# Patient Record
Sex: Male | Born: 1960 | Race: White | Hispanic: No | Smoking: Former smoker
Health system: Southern US, Community
[De-identification: ages and names within clinical notes are randomized; demographics above are authoritative.]

## PROBLEM LIST (undated history)

## (undated) DIAGNOSIS — M4800 Spinal stenosis, site unspecified: Secondary | ICD-10-CM

## (undated) DIAGNOSIS — R202 Paresthesia of skin: Secondary | ICD-10-CM

## (undated) DIAGNOSIS — I639 Cerebral infarction, unspecified: Secondary | ICD-10-CM

## (undated) DIAGNOSIS — B1920 Unspecified viral hepatitis C without hepatic coma: Secondary | ICD-10-CM

## (undated) HISTORY — PX: NECK SURGERY: SHX720

## (undated) HISTORY — DX: Paresthesia of skin: R20.2

## (undated) HISTORY — PX: SPINE SURGERY: SHX786

## (undated) HISTORY — DX: Spinal stenosis, site unspecified: M48.00

## (undated) HISTORY — PX: ELBOW SURGERY: SHX618

## (undated) HISTORY — PX: KNEE SURGERY: SHX244

---

## 2012-04-08 ENCOUNTER — Emergency Department: Payer: Self-pay | Admitting: Emergency Medicine

## 2013-02-20 ENCOUNTER — Inpatient Hospital Stay: Payer: Self-pay | Admitting: Psychiatry

## 2013-02-20 LAB — URINALYSIS, COMPLETE
Bacteria: NONE SEEN
Bilirubin,UR: NEGATIVE
Glucose,UR: NEGATIVE mg/dL
Ketone: NEGATIVE
Leukocyte Esterase: NEGATIVE
Nitrite: NEGATIVE
Ph: 5
Protein: NEGATIVE
RBC,UR: 1 /HPF
Specific Gravity: 1.002
Squamous Epithelial: NONE SEEN
WBC UR: NONE SEEN /HPF

## 2013-02-20 LAB — DRUG SCREEN, URINE
Amphetamines, Ur Screen: NEGATIVE
Barbiturates, Ur Screen: NEGATIVE
Benzodiazepine, Ur Scrn: NEGATIVE
Cannabinoid 50 Ng, Ur ~~LOC~~: NEGATIVE
Cocaine Metabolite,Ur ~~LOC~~: POSITIVE
MDMA (Ecstasy)Ur Screen: NEGATIVE
Methadone, Ur Screen: NEGATIVE
Opiate, Ur Screen: NEGATIVE
Phencyclidine (PCP) Ur S: NEGATIVE
Tricyclic, Ur Screen: NEGATIVE

## 2013-02-20 LAB — COMPREHENSIVE METABOLIC PANEL
ANION GAP: 2 — AB (ref 7–16)
Albumin: 4.4 g/dL (ref 3.4–5.0)
Alkaline Phosphatase: 62 U/L
BILIRUBIN TOTAL: 0.4 mg/dL (ref 0.2–1.0)
BUN: 6 mg/dL — ABNORMAL LOW (ref 7–18)
CALCIUM: 9.3 mg/dL (ref 8.5–10.1)
CHLORIDE: 103 mmol/L (ref 98–107)
CREATININE: 0.99 mg/dL (ref 0.60–1.30)
Co2: 28 mmol/L (ref 21–32)
EGFR (Non-African Amer.): >60
GLUCOSE: 92 mg/dL (ref 65–99)
OSMOLALITY: 264 (ref 275–301)
Potassium: 3.9 mmol/L (ref 3.5–5.1)
SGOT(AST): 211 U/L — ABNORMAL HIGH (ref 15–37)
SGPT (ALT): 238 U/L — ABNORMAL HIGH (ref 12–78)
Sodium: 133 mmol/L — ABNORMAL LOW (ref 136–145)
Total Protein: 8.9 g/dL — ABNORMAL HIGH (ref 6.4–8.2)

## 2013-02-20 LAB — ETHANOL
Ethanol %: 0.134 % — ABNORMAL HIGH (ref 0.000–0.080)
Ethanol: 134 mg/dL

## 2013-02-20 LAB — SALICYLATE LEVEL: Salicylates, Serum: 2.5 mg/dL

## 2013-02-20 LAB — ACETAMINOPHEN LEVEL: Acetaminophen: <2 ug/mL

## 2013-02-20 LAB — CBC
HCT: 49.3 % (ref 40.0–52.0)
HGB: 16.7 g/dL (ref 13.0–18.0)
MCH: 32.9 pg (ref 26.0–34.0)
MCHC: 34 g/dL (ref 32.0–36.0)
MCV: 97 fL (ref 80–100)
Platelet: 191 10*3/uL (ref 150–440)
RBC: 5.09 10*6/uL (ref 4.40–5.90)
RDW: 13.6 % (ref 11.5–14.5)
WBC: 6.9 10*3/uL (ref 3.8–10.6)

## 2013-06-04 ENCOUNTER — Emergency Department: Payer: Self-pay | Admitting: Emergency Medicine

## 2013-06-04 LAB — COMPREHENSIVE METABOLIC PANEL
ALT: 29 U/L (ref 12–78)
AST: 54 U/L — AB (ref 15–37)
Albumin: 3.6 g/dL (ref 3.4–5.0)
Alkaline Phosphatase: 64 U/L
Anion Gap: 11 (ref 7–16)
BUN: 11 mg/dL (ref 7–18)
Bilirubin,Total: 0.3 mg/dL (ref 0.2–1.0)
CREATININE: 1.73 mg/dL — AB (ref 0.60–1.30)
Calcium, Total: 8.3 mg/dL — ABNORMAL LOW (ref 8.5–10.1)
Chloride: 107 mmol/L (ref 98–107)
Co2: 22 mmol/L (ref 21–32)
EGFR (African American): 52 — ABNORMAL LOW
EGFR (Non-African Amer.): 45 — ABNORMAL LOW
Glucose: 93 mg/dL (ref 65–99)
Osmolality: 278 (ref 275–301)
Potassium: 3.8 mmol/L (ref 3.5–5.1)
SODIUM: 140 mmol/L (ref 136–145)
TOTAL PROTEIN: 7.5 g/dL (ref 6.4–8.2)

## 2013-06-04 LAB — URINALYSIS, COMPLETE
Bilirubin,UR: NEGATIVE
Blood: NEGATIVE
GLUCOSE, UR: NEGATIVE mg/dL (ref 0–75)
Ketone: NEGATIVE
LEUKOCYTE ESTERASE: NEGATIVE
Nitrite: NEGATIVE
PROTEIN: NEGATIVE
Ph: 5 (ref 4.5–8.0)
RBC,UR: <1 /HPF (ref 0–5)
SPECIFIC GRAVITY: 1.004 (ref 1.003–1.030)
Squamous Epithelial: NONE SEEN
WBC UR: NONE SEEN /HPF (ref 0–5)

## 2013-06-04 LAB — DRUG SCREEN, URINE
AMPHETAMINES, UR SCREEN: NEGATIVE (ref ?–1000)
Barbiturates, Ur Screen: NEGATIVE (ref ?–200)
Benzodiazepine, Ur Scrn: NEGATIVE (ref ?–200)
CANNABINOID 50 NG, UR ~~LOC~~: NEGATIVE (ref ?–50)
Cocaine Metabolite,Ur ~~LOC~~: POSITIVE (ref ?–300)
MDMA (ECSTASY) UR SCREEN: NEGATIVE (ref ?–500)
Methadone, Ur Screen: NEGATIVE (ref ?–300)
Opiate, Ur Screen: NEGATIVE (ref ?–300)
PHENCYCLIDINE (PCP) UR S: NEGATIVE (ref ?–25)
Tricyclic, Ur Screen: NEGATIVE (ref ?–1000)

## 2013-06-04 LAB — CBC
HCT: 43.7 % (ref 40.0–52.0)
HGB: 15 g/dL (ref 13.0–18.0)
MCH: 32.1 pg (ref 26.0–34.0)
MCHC: 34.2 g/dL (ref 32.0–36.0)
MCV: 94 fL (ref 80–100)
PLATELETS: 222 10*3/uL (ref 150–440)
RBC: 4.66 10*6/uL (ref 4.40–5.90)
RDW: 13 % (ref 11.5–14.5)
WBC: 9.8 10*3/uL (ref 3.8–10.6)

## 2013-06-04 LAB — ETHANOL
Ethanol %: 0.133 % — ABNORMAL HIGH (ref 0.000–0.080)
Ethanol: 133 mg/dL

## 2013-06-04 LAB — TSH: THYROID STIMULATING HORM: 0.875 u[IU]/mL

## 2013-06-04 LAB — SALICYLATE LEVEL: Salicylates, Serum: 2.4 mg/dL

## 2013-06-04 LAB — TROPONIN I

## 2013-06-04 LAB — ACETAMINOPHEN LEVEL

## 2014-05-19 NOTE — H&P (Signed)
PATIENT NAME:  Steven Steven Gardner, Steven Steven Gardner#:  [id number] DATE OF BIRTH:  Feb 24, 1960  DATE OF ADMISSION:  02/20/2013  REFERRING PHYSICIAN: Emergency Room MD   ATTENDING PHYSICIAN: Steven B. Jennet MaduroPucilowska, MD   IDENTIFYING DATA: Steven [name] Steven Steven Gardner is a 54 year old male with history of alcoholism and cocaine use.   CHIEF COMPLAINT: "I need detox."   HISTORY OF PRESENT ILLNESS:  Steven [name] Steven Steven Gardner came to [facility] Emergency Room asking for alcohol detox. Initially, we planned to send him to [facility]. However, it turns out that the patient is a registered sex offender and was not accepted at [facility]. He was admitted to the hospital. He reports heavy drinking of a case of beer each day. He also has been using cocaine but minimizes his use. He denies symptoms of depression, psychosis or anxiety. He has never been in treatment for substance abuse and does not report any period of sobriety since he started drinking in his teens.   PAST PSYCHIATRIC HISTORY: As above. No suicide attempts. No substance abuse treatment.   FAMILY PSYCHIATRIC HISTORY: Mother with anxiety.   PAST MEDICAL HISTORY: Hepatitis B and C, status post back injury, chronic pain, status post knee surgery.   ALLERGIES: No known drug allergies.   MEDICATIONS ON ADMISSION: None.   SOCIAL HISTORY: He is from [place] and would very much like to go to a big city. He is married and has a 608-year-old son. The patient was staying with his father, who is now demented and placed in a nursing home. Unable to provide for the family. His wife and 318-year-old son relocated [place] where they are staying at a homeless shelter. The patient ended up in [place]. He tells me that he is disabled following a back injury that was work related many years ago. He is not able to afford any medical care, even though he believes that he needs medication for his back problem. He lost his Medicaid but apparently has it back. He has his Medicaid card for the  past 2 weeks and is looking to establish care with new providers. He does not think that he will be staying in [place] area.   REVIEW OF SYSTEMS:   CONSTITUTIONAL: No fevers or chills. No weight changes.  EYES: No double or blurred vision.  ENT: No hearing loss.  RESPIRATORY: No shortness of breath or cough.  CARDIOVASCULAR: No chest pain or orthopnea.  GASTROINTESTINAL: No abdominal pain, nausea, vomiting or diarrhea.  GENITOURINARY: No incontinence or frequency.  ENDOCRINE: No heat or cold intolerance.  LYMPHATIC: No anemia or easy bruising.  INTEGUMENTARY: No acne or rash.  MUSCULOSKELETAL: Positive for back and knee pain.  NEUROLOGIC: No tingling or weakness.  PSYCHIATRIC: See history of present illness for details.   PHYSICAL EXAMINATION: VITAL SIGNS: Blood pressure 139/93, pulse 84, respirations 20, temperature 98.2.  GENERAL: This is an obese male in no acute distress.  HEENT: The pupils are equal, round, and reactive to light. Sclerae are anicteric.  NECK: Supple. No thyromegaly.  LUNGS: Clear to auscultation. No dullness to percussion.  HEART: Regular rhythm and rate. No murmurs, rubs or gallops.  ABDOMEN: Soft, nontender, nondistended. Positive bowel sounds.  MUSCULOSKELETAL: Normal muscle strength in all extremities.  SKIN: No rashes or bruises.  LYMPHATIC: No cervical adenopathy.  NEUROLOGIC: Cranial nerves II through XII are intact.   LABORATORY DATA: Chemistries are within normal limits, except sodium 133. Blood alcohol level 0.134. LFTs within normal limits except for AST of 211 and ALT of  238. Urine tox screen is positive for cocaine. CBC within normal limits. Urinalysis is not suggestive of urinary tract infection. Serum acetaminophen and salicylates are low.   MENTAL STATUS EXAMINATION ON ADMISSION: The patient is asleep and not really excited to be talking to me. I let him sleep in the morning, came back in the afternoon. He is more awake, pleasant, polite, and  cooperative. He is poorly groomed, wearing hospital scrubs. He maintains limited eye contact. His speech is soft. Mood is depressed with flat affect. Thought process: Logical and goal oriented. Thought content: He denies suicidal or homicidal ideation. There are no delusions or paranoia. There are no auditory or visual hallucinations. His cognition is grossly intact. He registers 3/3 and recalls 3/3 objects after 5 minutes. He can spell "world" forward and backward. He knows current president. His intelligence and fund of knowledge are average. His insight and judgment are poor.   SUICIDE RISK ASSESSMENT ON ADMISSION: This is a patient with history of alcoholism and some anxiety, but no mood symptoms.   DIAGNOSES: AXIS I: Alcohol dependence, cocaine dependence.  AXIS II: Deferred.  AXIS III: Hepatitis B and C, obesity, chronic pain.  AXIS IV: Substance abuse, housing, employment, financial, family conflict, primary support.  AXIS V: Global Assessment of Functioning 35.   PLAN: The patient was admitted to [facility] Medicine Unit for safety, stabilization, and medication management. He was initially placed on suicide precautions and was closely monitored for any unsafe behaviors. He underwent full psychiatric and risk assessment. He received pharmacotherapy, individual and group psychotherapy, substance abuse counseling, and support from therapeutic milieu.  1.  Alcohol detox: He is on a CIWA protocol. We added Librium to limit p.r.n. Ativan. We will monitor symptoms of withdrawal.  2.  Substance abuse treatment: The patient thinks that he can go for long-term treatment center in a big city. He will talk to substance abuse counselor about that. He is not interested in going to [facility] rehab facility.  3.  Disposition: To be established.    ____________________________ Ellin Goodie. Jennet Maduro, MD jbp:jcm D: 02/21/2013 14:43:29 ET T: 02/21/2013 15:36:36  ET JOB#: [id number]  cc: Steven B. Jennet Maduro, MD, <Dictator> Shari Prows MD ELECTRONICALLY SIGNED 03/25/2013 6:34

## 2015-03-28 ENCOUNTER — Emergency Department
Admission: EM | Admit: 2015-03-28 | Discharge: 2015-03-28 | Disposition: A | Discharge status: Home / Self Care | Attending: Emergency Medicine | Admitting: Emergency Medicine

## 2015-03-28 ENCOUNTER — Emergency Department

## 2015-03-28 ENCOUNTER — Encounter: Payer: Self-pay | Admitting: Emergency Medicine

## 2015-03-28 DIAGNOSIS — Y92148 Other place in prison as the place of occurrence of the external cause: Secondary | ICD-10-CM | POA: Diagnosis not present

## 2015-03-28 DIAGNOSIS — S01111A Laceration without foreign body of right eyelid and periocular area, initial encounter: Secondary | ICD-10-CM | POA: Diagnosis not present

## 2015-03-28 DIAGNOSIS — E119 Type 2 diabetes mellitus without complications: Secondary | ICD-10-CM | POA: Diagnosis not present

## 2015-03-28 DIAGNOSIS — Z87891 Personal history of nicotine dependence: Secondary | ICD-10-CM | POA: Diagnosis not present

## 2015-03-28 DIAGNOSIS — W01198A Fall on same level from slipping, tripping and stumbling with subsequent striking against other object, initial encounter: Secondary | ICD-10-CM | POA: Insufficient documentation

## 2015-03-28 DIAGNOSIS — F10239 Alcohol dependence with withdrawal, unspecified: Secondary | ICD-10-CM | POA: Insufficient documentation

## 2015-03-28 DIAGNOSIS — S0990XA Unspecified injury of head, initial encounter: Secondary | ICD-10-CM | POA: Diagnosis present

## 2015-03-28 DIAGNOSIS — Y998 Other external cause status: Secondary | ICD-10-CM | POA: Insufficient documentation

## 2015-03-28 DIAGNOSIS — Y9389 Activity, other specified: Secondary | ICD-10-CM | POA: Insufficient documentation

## 2015-03-28 DIAGNOSIS — S0191XA Laceration without foreign body of unspecified part of head, initial encounter: Secondary | ICD-10-CM

## 2015-03-28 HISTORY — DX: Cerebral infarction, unspecified: I63.9

## 2015-03-28 HISTORY — DX: Unspecified viral hepatitis C without hepatic coma: B19.20

## 2015-03-28 MED ORDER — LORAZEPAM 2 MG PO TABS
2.0000 mg | ORAL_TABLET | Freq: Once | ORAL | Status: AC
  Administered 2015-03-28: 2 mg via ORAL
  Filled 2015-03-28: qty 1

## 2015-03-28 NOTE — ED Notes (Signed)
Approx 1 inch laceration to outer right temple sustained earlier tonight when patient fell out of bed, no LOC.

## 2015-03-28 NOTE — ED Notes (Addendum)
Pt hands and feet cuffed with handcuffs/shackles by [employer]. Color WNL, pulses strong and present. Pt in NAD. Officer at bedside.

## 2015-03-28 NOTE — ED Notes (Signed)
Pt ambulatory to triage, from jail; Pt reports fell OOB while sleeping and hit head on table;lac to right side temple; pt reports generalized HA; st drinks daily (8-10 40s/day); last drink this morning; st going thru withdrawal and was told they could not give him anything  ; pt with tremors

## 2015-03-28 NOTE — ED Notes (Signed)
Patient transported to CT 

## 2015-03-28 NOTE — Discharge Instructions (Signed)
Stitches, Staples, or Adhesive Wound Closure °Health care providers use stitches (sutures), staples, and certain glue (skin adhesives) to hold skin together while it heals (wound closure). You may need this treatment after you have surgery or if you cut your skin accidentally. These methods help your skin to heal more quickly and make it less likely that you will have a scar. A wound may take several months to heal completely. °The type of wound you have determines when your wound gets closed. In most cases, the wound is closed as soon as possible (primary skin closure). Sometimes, closure is delayed so the wound can be cleaned and allowed to heal naturally. This reduces the chance of infection. Delayed closure may be needed if your wound: °· Is caused by a bite. °· Happened more than 6 hours ago. °· Involves loss of skin or the tissues under the skin. °· Has dirt or debris in it that cannot be removed. °· Is infected. °WHAT ARE THE DIFFERENT KINDS OF WOUND CLOSURES? °There are many options for wound closure. The one that your health care provider uses depends on how deep and how large your wound is. °Adhesive Glue °To use this type of glue to close a wound, your health care provider holds the edges of the wound together and paints the glue on the surface of your skin. You may need more than one layer of glue. Then the wound may be covered with a light bandage (dressing). °This type of skin closure may be used for small wounds that are not deep (superficial). Using glue for wound closure is less painful than other methods. It does not require a medicine that numbs the area (local anesthetic). This method also leaves nothing to be removed. Adhesive glue is often used for children and on facial wounds. °Adhesive glue cannot be used for wounds that are deep, uneven, or bleeding. It is not used inside of a wound.  °Adhesive Strips °These strips are made of sticky (adhesive), porous paper. They are applied across your  skin edges like a regular adhesive bandage. You leave them on until they fall off. °Adhesive strips may be used to close very superficial wounds. They may also be used along with sutures to improve the closure of your skin edges.  °Sutures °Sutures are the oldest method of wound closure. Sutures can be made from natural substances, such as silk, or from synthetic materials, such as nylon and steel. They can be made from a material that your body can break down as your wound heals (absorbable), or they can be made from a material that needs to be removed from your skin (nonabsorbable). They come in many different strengths and sizes. °Your health care provider attaches the sutures to a steel needle on one end. Sutures can be passed through your skin, or through the tissues beneath your skin. Then they are tied and cut. Your skin edges may be closed in one continuous stitch or in separate stitches. °Sutures are strong and can be used for all kinds of wounds. Absorbable sutures may be used to close tissues under the skin. The disadvantage of sutures is that they may cause skin reactions that lead to infection. Nonabsorbable sutures need to be removed. °Staples °When surgical staples are used to close a wound, the edges of your skin on both sides of the wound are brought close together. A staple is placed across the wound, and an instrument secures the edges together. Staples are often used to close surgical cuts (  incisions). °Staples are faster to use than sutures, and they cause less skin reaction. Staples need to be removed using a tool that bends the staples away from your skin. °HOW DO I CARE FOR MY WOUND CLOSURE? °· Take medicines only as directed by your health care provider. °· If you were prescribed an antibiotic medicine for your wound, finish it all even if you start to feel better. °· Use ointments or creams only as directed by your health care provider. °· Wash your hands with soap and water before and  after touching your wound. °· Do not soak your wound in water. Do not take baths, swim, or use a hot tub until your health care provider approves. °· Ask your health care provider when you can start showering. Cover your wound if directed by your health care provider. °· Do not take out your own sutures or staples. °· Do not pick at your wound. Picking can cause an infection. °· Keep all follow-up visits as directed by your health care provider. This is important. °HOW LONG WILL I HAVE MY WOUND CLOSURE? °· Leave adhesive glue on your skin until the glue peels away. °· Leave adhesive strips on your skin until the strips fall off. °· Absorbable sutures will dissolve within several days. °· Nonabsorbable sutures and staples must be removed. The location of the wound will determine how long they stay in. This can range from several days to a couple of weeks. °WHEN SHOULD I SEEK HELP FOR MY WOUND CLOSURE? °Contact your health care provider if: °· You have a fever. °· You have chills. °· You have drainage, redness, swelling, or pain at your wound. °· There is a bad smell coming from your wound. °· The skin edges of your wound start to separate after your sutures have been removed. °· Your wound becomes thick, raised, and darker in color after your sutures come out (scarring). °  °This information is not intended to replace advice given to you by your health care provider. Make sure you discuss any questions you have with your health care provider. °  °Document Released: 10/07/2000 Document Revised: 02/02/2014 Document Reviewed: 06/21/2013 °Elsevier Interactive Patient Education ©2016 [employer] Inc[employer] ° °Head Injury, Adult °You have a head injury. Headaches and throwing up (vomiting) are common after a head injury. It should be easy to wake up from sleeping. Sometimes you must stay in the hospital. Most problems happen within the first 24 hours. Side effects may occur up to 7-10 days after the injury.  °WHAT ARE THE TYPES OF  HEAD INJURIES? °Head injuries can be as minor as a bump. Some head injuries can be more severe. More severe head injuries include: °· A jarring injury to the brain (concussion). °· A bruise of the brain (contusion). This mean there is bleeding in the brain that can cause swelling. °· A cracked skull (skull fracture). °· Bleeding in the brain that collects, clots, and forms a bump (hematoma). °WHEN SHOULD I GET HELP RIGHT AWAY?  °· You are confused or sleepy. °· You cannot be woken up. °· You feel sick to your stomach (nauseous) or keep throwing up (vomiting). °· Your dizziness or unsteadiness is getting worse. °· You have very bad, lasting headaches that are not helped by medicine. Take medicines only as told by your doctor. °· You cannot use your arms or legs like normal. °· You cannot walk. °· You notice changes in the black spots in the center of the colored part of   your eye (pupil). °· You have clear or bloody fluid coming from your nose or ears. °· You have trouble seeing. °During the next 24 hours after the injury, you must stay with someone who can watch you. This person should get help right away (call 911 in the [place] if you start to shake and are not able to control it (have seizures), you pass out, or you are unable to wake up. °HOW CAN I PREVENT A HEAD INJURY IN THE FUTURE? °· Wear seat belts. °· Wear a helmet while bike riding and playing sports like football. °· Stay away from dangerous activities around the house. °WHEN CAN I RETURN TO NORMAL ACTIVITIES AND ATHLETICS? °See your doctor before doing these activities. You should not do normal activities or play contact sports until 1 week after the following symptoms have stopped: °· Headache that does not go away. °· Dizziness. °· Poor attention. °· Confusion. °· Memory problems. °· Sickness to your stomach or throwing up. °· Tiredness. °· Fussiness. °· Bothered by bright lights or loud noises. °· Anxiousness or depression. °· Restless sleep. °MAKE SURE  YOU:  °· Understand these instructions. °· Will watch your condition. °· Will get help right away if you are not doing well or get worse. °  °This information is not intended to replace advice given to you by your health care provider. Make sure you discuss any questions you have with your health care provider. °  °Document Released: 12/26/2007 Document Revised: 02/02/2014 Document Reviewed: 09/19/2012 °Elsevier Interactive Patient Education ©2016 Elsevier Inc. ° °

## 2015-03-28 NOTE — ED Provider Notes (Signed)
Time Seen: Approximately 0 300  I have reviewed the triage notes  Chief Complaint: Laceration   History of Present Illness: Steven Gardner is a 55 y.o. male who states he has a long history of alcohol abuse and he was having some withdrawal type symptoms of agitation in bed dreams and apparently fell out of the bunk at jail striking his right temporal area on a table. Patient states he had some nausea but no persistent vomiting states he's been having some mild tremors without hallucinations. Patient's currently in custody of the [facility] department. They currently have the patient in handcuffs. Patient denies any injury outside of a laceration right lateral surface of the eye is  on his face. He has normal vision and there does not appear to be any trauma directly to the eye. Since his last tetanus shot was within 10 years and he denies any neck, thoracic, lumbar spine pain   Past Medical History  Diagnosis Date  . Stroke (HCC)   . Hepatitis C   . Diabetes mellitus without complication (HCC)     There are no active problems to display for this patient.   Past Surgical History  Procedure Laterality Date  . Elbow surgery    . Neck surgery    . Knee surgery      Past Surgical History  Procedure Laterality Date  . Elbow surgery    . Neck surgery    . Knee surgery      No current outpatient prescriptions on file.  Allergies:  Review of patient's allergies indicates no known allergies.  Family History: No family history on file.  Social History: Social History  Substance Use Topics  . Smoking status: Former Games developer  . Smokeless tobacco: None  . Alcohol Use: Yes     Comment: 8-10 40oz/beer daily     Review of Systems:   Constitutional: No fever Eyes: No visual disturbances Cardiac: No chest pain Respiratory: No shortness of breath, wheezing, or stridor Abdomen: No abdominal pain, no vomiting, No diarrhea Extremities: No extremity pain or trauma  Skin: No rashes, easy bruising Neurologic: No focal weakness, trouble with speech or swollowing   Physical Exam:  ED Triage Vitals  Enc Vitals Group     BP 03/28/15 0233 156/92 mmHg     Pulse Rate 03/28/15 0233 82     Resp 03/28/15 0233 20     Temp 03/28/15 0233 97.9 F (36.6 C)     Temp Source 03/28/15 0233 Oral     SpO2 03/28/15 0233 97 %     Weight 03/28/15 0233 200 lb (90.719 kg)     Height 03/28/15 0233  (1.803 m)     Head Cir --      Peak Flow --      Pain Score 03/28/15 0233 7     Pain Loc --      Pain Edu? --      Excl. in GC? --     General: Awake , Alert , and Oriented times 3; GCS 15 Head: Patient has a 1 cm somewhat jagged linear vertically located laceration lateral to his right eye. There is no facial instability. Eyes: Pupils equal , round, reactive to light Nose/Throat: No nasal drainage, patent upper airway without erythema or exudate.  Neck: Supple, Full range of motion, No anterior adenopathy or palpable thyroid masses Lungs: Clear to ascultation without wheezes , rhonchi, or rales Heart: Regular rate, regular rhythm without murmurs , gallops ,  or rubs Extremities: 2 plus symmetric pulses. No edema, clubbing or cyanosis Neurologic:, Motor symmetric without deficits, sensory intact Skin: warm, dry, no rashes     Radiology: *  EXAM: CT HEAD WITHOUT CONTRAST  TECHNIQUE: Contiguous axial images were obtained from the base of the skull through the vertex without intravenous contrast.  COMPARISON: None.  FINDINGS: There is no evidence of acute infarction, mass lesion, or intra- or extra-axial hemorrhage on CT.  The posterior fossa, including the cerebellum, brainstem and fourth ventricle, is within normal limits. The third and lateral ventricles, and basal ganglia are unremarkable in appearance. The cerebral hemispheres are symmetric in appearance, with normal gray-white differentiation. No mass effect or midline shift is seen.  There is  no evidence of fracture; visualized osseous structures are unremarkable in appearance. The visualized portions of the orbits are within normal limits. The paranasal sinuses and mastoid air cells are well-aerated. The patient's right-sided soft tissue laceration is not well characterized on CT.  IMPRESSION: No evidence of traumatic intracranial injury or fracture.   Electronically Signed By: Roanna Raider M.D. On: 03/28/2015 03:27     I personally reviewed the radiologic studies   Procedures: * Patient had repair of his right facial laceration with tissue adhesive glue. The laceration is simple closure single layer with a glue which patient was observed and appeared to tolerate the procedure well. His wound was previously cleaned   ED Course:  Patient's stay here was uneventful he does not appear to suffer any significant head injury from his fall such as a subdural, epidural, or significant contusion. Patient's laceration was repaired. He also received Ativan for his withdrawal symptoms which seemed to decrease his tenderness. No felt he was not likely to be an delirium tremens at this time.  Assessment: Head laceration Acute closed head injury Mild alcohol withdrawal symptoms Laceration closure with adhesive glue 1 cm non-complicated   Plan:  Outpatient Patient was discharged in custody of the police department. Patient was advised to return immediately if condition worsens. Patient was advised to follow up with their primary care physician or other specialized physicians involved in their outpatient care             Jennye Moccasin, MD 03/28/15 [phone]

## 2015-03-28 NOTE — ED Notes (Signed)
Pt alert and oriented X4, active, cooperative, pt in NAD. RR even and unlabored, color WNL.  Pt informed to return if any life threatening symptoms occur.   

## 2016-05-29 ENCOUNTER — Emergency Department
Admission: EM | Admit: 2016-05-29 | Discharge: 2016-05-29 | Disposition: A | Discharge status: Home / Self Care | Attending: Student in an Organized Health Care Education/Training Program | Admitting: Student in an Organized Health Care Education/Training Program

## 2016-05-29 ENCOUNTER — Encounter: Payer: Self-pay | Admitting: Emergency Medicine

## 2016-05-29 ENCOUNTER — Emergency Department

## 2016-05-29 DIAGNOSIS — Z87891 Personal history of nicotine dependence: Secondary | ICD-10-CM | POA: Insufficient documentation

## 2016-05-29 DIAGNOSIS — R930 Abnormal findings on diagnostic imaging of skull and head, not elsewhere classified: Secondary | ICD-10-CM | POA: Insufficient documentation

## 2016-05-29 DIAGNOSIS — M542 Cervicalgia: Secondary | ICD-10-CM | POA: Insufficient documentation

## 2016-05-29 DIAGNOSIS — E119 Type 2 diabetes mellitus without complications: Secondary | ICD-10-CM | POA: Insufficient documentation

## 2016-05-29 DIAGNOSIS — F1010 Alcohol abuse, uncomplicated: Secondary | ICD-10-CM

## 2016-05-29 DIAGNOSIS — F1019 Alcohol abuse with unspecified alcohol-induced disorder: Secondary | ICD-10-CM | POA: Insufficient documentation

## 2016-05-29 LAB — GLUCOSE, CAPILLARY: Glucose-Capillary: 88 mg/dL (ref 65–99)

## 2016-05-29 MED ORDER — ONDANSETRON 4 MG PO TBDP
4.0000 mg | ORAL_TABLET | Freq: Once | ORAL | Status: AC
  Administered 2016-05-29: 4 mg via ORAL
  Filled 2016-05-29: qty 1

## 2016-05-29 NOTE — ED Provider Notes (Signed)
[facility] Emergency Department Provider Note    First MD Initiated Contact with Patient 05/29/16 1931     (approximate)  I have reviewed the triage vital signs and the nursing notes.   HISTORY  Chief Complaint Medical Clearance    HPI DARAY POLGAR is a 56 y.o. male with remote history of stroke as well as status post laminectomy and multiple chronic back pain issues presents to the ER and police custody after being arrested for DUI today. Patient was taken to prison and began complaining of multiple complaints including neck pain from multiple falls as well as the numbness and weakness in his arms. States the numbness is more on the right side. Denies any chest pain. No shortness of breath. No abdominal pain. Has had chronic numbness to his lower legs. This is not new.   Past Medical History:  Diagnosis Date  . Diabetes mellitus without complication (HCC)   . Hepatitis C   . Stroke [facility])    No family history on file. Past Surgical History:  Procedure Laterality Date  . ELBOW SURGERY    . KNEE SURGERY    . NECK SURGERY     There are no active problems to display for this patient.     Prior to Admission medications   Not on File    Allergies Patient has no known allergies.    Social History Social History  Substance Use Topics  . Smoking status: Former Games developer  . Smokeless tobacco: Never Used  . Alcohol use Yes     Comment: 8-10 40oz/beer daily    Review of Systems Patient denies headaches, rhinorrhea, blurry vision, numbness, shortness of breath, chest pain, edema, cough, abdominal pain, nausea, vomiting, diarrhea, dysuria, fevers, rashes or hallucinations unless otherwise stated above in HPI. ____________________________________________   PHYSICAL EXAM:  VITAL SIGNS: Vitals:   05/29/16 1902  BP: 131/83  Pulse: 89  Resp: 18  Temp: 98.6 F (37 C)    Constitutional: Alert and oriented. Disheveled, in no acute  distress. Eyes: Conjunctivae are normal. PERRL. EOMI. Head: Atraumatic. Nose: No congestion/rhinnorhea. Mouth/Throat: Mucous membranes are moist. Poor dentition, Oropharynx non-erythematous. Neck: No stridor. ttp of bilateral paraspinal muscles Hematological/Lymphatic/Immunilogical: No cervical lymphadenopathy. Cardiovascular: Normal rate, regular rhythm. Grossly normal heart sounds.  Good peripheral circulation. Respiratory: Normal respiratory effort.  No retractions. Lungs CTAB. Gastrointestinal: Soft and nontender. No distention. No abdominal bruits. No CVA tenderness. Musculoskeletal: No lower extremity tenderness nor edema.  No joint effusions. Neurologic:  CN- intact.  No facial droop, Normal FNF.  Normal heel to shin.  Sensation intact bilaterally. Normal speech and language. No gross focal neurologic deficits are appreciated. Good strenht bilaterally.  SILT bilaterally. Skin:  Skin is warm, dry and intact. No rash noted. Psychiatric: withdrawn answer appropriately to questions ____________________________________________   LABS (all labs ordered are listed, but only abnormal results are displayed)  Results for orders placed or performed during the hospital encounter of 05/29/16 (from the past 24 hour(s))  Glucose, capillary     Status: None   Collection Time: 05/29/16  8:01 PM  Result Value Ref Range   Glucose-Capillary 88 65 - 99 mg/dL   ____________________________________________ ____________________________________________  RADIOLOGY  I personally reviewed all radiographic images ordered to evaluate for the above acute complaints and reviewed radiology reports and findings.  These findings were personally discussed with the patient.  Please see medical record for radiology report.  ____________________________________________   PROCEDURES  Procedure(s) performed:  Procedures  Critical Care performed: no ____________________________________________   INITIAL  IMPRESSION / ASSESSMENT AND PLAN / ED COURSE  Pertinent labs & imaging results that were available during my care of the patient were reviewed by me and considered in my medical decision making (see chart for details).  DDX: fracture, trauma, substance abuse, dehydration  Earlyne IbaChristopher D Rieger is a 56 y.o. who presents to the ED with complaints as described above and police custody. Patient is afebrile and hemodynamically stable. Smells of alcohol but appears clinically sober. CT imaging ordered to evaluate for the patient's complaints given falls with reported head injury and neck pain. CT imaging shows no acute traumatic injury or abnormality. He has no neuro deficits to suggest acute neurosurgical process. Patient is refusing lab draws. Glucose is normal. Patient is tolerating oral hydration.  Clinical Course as of May 29 2144  Fri May 29, 2016  2126 The patient got up out of his chair and put a blanket on the floor to lay down. Was able to ambulate with steady gait.  At this point I do believe patient is medically cleared for DC to jail.  [PR]    Clinical Course User Index [PR] Willy EddyPatrick Alberta Cairns, MD     ____________________________________________   FINAL CLINICAL IMPRESSION(S) / ED DIAGNOSES  Final diagnoses:  Neck pain  Alcohol abuse      NEW MEDICATIONS STARTED DURING THIS VISIT:  New Prescriptions   No medications on file     Note:  This document was prepared using Dragon voice recognition software and may include unintentional dictation errors.    Willy EddyPatrick Kairyn Olmeda, MD 05/29/16 2146

## 2016-05-29 NOTE — ED Notes (Signed)
Pt stood up from wheelchair, walked over to the nurses station desk, laid blanket on floor and laid down on floor.  Asked pt to get up and Ill put him on a bed.  Assisted pt to a standing position with [employer] officer (pt is in handcuffs), pt walked without incident to hallway bed 20.  MD and RN notified

## 2016-05-29 NOTE — ED Notes (Signed)
Pt returned from CT att.

## 2016-05-29 NOTE — ED Notes (Signed)
Pt adamantly refusing blood work; Dr Roxan Hockeyobinson notified
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
## 2016-05-29 NOTE — ED Notes (Signed)
Pt states he hasnt eaten in 2 days, given 2 orange juice and a tray.  Pt ate and drank everything.
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
## 2016-06-17 ENCOUNTER — Encounter: Payer: Self-pay | Admitting: Emergency Medicine

## 2016-06-17 ENCOUNTER — Emergency Department
Admission: EM | Admit: 2016-06-17 | Discharge: 2016-06-17 | Attending: Emergency Medicine | Admitting: Emergency Medicine

## 2016-06-17 DIAGNOSIS — E119 Type 2 diabetes mellitus without complications: Secondary | ICD-10-CM | POA: Insufficient documentation

## 2016-06-17 DIAGNOSIS — F19129 Other psychoactive substance abuse with intoxication, unspecified: Secondary | ICD-10-CM | POA: Insufficient documentation

## 2016-06-17 DIAGNOSIS — F19920 Other psychoactive substance use, unspecified with intoxication, uncomplicated: Secondary | ICD-10-CM

## 2016-06-17 DIAGNOSIS — R4689 Other symptoms and signs involving appearance and behavior: Secondary | ICD-10-CM

## 2016-06-17 DIAGNOSIS — Z87891 Personal history of nicotine dependence: Secondary | ICD-10-CM | POA: Insufficient documentation

## 2016-06-17 DIAGNOSIS — F919 Conduct disorder, unspecified: Secondary | ICD-10-CM | POA: Insufficient documentation

## 2016-06-17 NOTE — ED Notes (Signed)
Pt given sandwich tray 

## 2016-06-17 NOTE — ED Notes (Addendum)
Pt is awake and A&O x4 - he is able to stand and ambulate independently - pt would not allow nurse to check BP put did allow pulse to be checked - rate 94 - Dr Silverio LayYao notified

## 2016-06-17 NOTE — ED Provider Notes (Signed)
[facility] Emergency Department Provider Note  ____________________________________________  Time seen: Approximately 2:35 PM  I have reviewed the triage vital signs and the nursing notes.   HISTORY  Chief Complaint Medical Clearance Level 5 caveat:  Portions of the history and physical were unable to be obtained due to the patient's a Poor historian and uncooperative   HPI Steven Gardner is a 56 y.o. male brought to the ED in custody of police for medical evaluation. Patient denies any acute complaints. Denies pain chest pain shortness of breath fever chills vomiting. He appears drowsy and is slurring his speech but denies alcohol, denies any opioid or benzodiazepine use. Denies smoking or drug use.  Patient's only complaint is that his right hand feels numb due to the handcuffs.   Past Medical History:  Diagnosis Date  . Diabetes mellitus without complication (HCC)   . Hepatitis C   . Stroke [facility](HCC)      There are no active problems to display for this patient.    Past Surgical History:  Procedure Laterality Date  . ELBOW SURGERY    . KNEE SURGERY    . NECK SURGERY       Prior to Admission medications   Medication Sig Start Date End Date Taking? Authorizing Provider  tamsulosin (FLOMAX) 0.4 MG CAPS capsule Take 0.4 mg by mouth daily.    [provider]     Allergies Patient has no known allergies.   No family history on file.  Social History Social History  Substance Use Topics  . Smoking status: Former Games developermoker  . Smokeless tobacco: Never Used  . Alcohol use Yes     Comment: 8-10 40oz/beer daily    Review of Systems  Constitutional:   No fever or chills.  ENT:   No sore throat. No rhinorrhea. Cardiovascular:   No chest pain or syncope. Respiratory:   No dyspnea or cough. Gastrointestinal:   Negative for abdominal pain, vomiting and diarrhea.  Musculoskeletal:   Negative for focal pain or swelling All other  systems reviewed and are negative except as documented above in ROS and HPI. ROS may not be reliable due to patient's uncooperativeness.  ____________________________________________   PHYSICAL EXAM:  VITAL SIGNS: ED Triage Vitals  Enc Vitals Group     BP 06/17/16 1354 125/78     Pulse Rate 06/17/16 1354 (!) 116     Resp 06/17/16 1354 16     Temp 06/17/16 1354 99 F (37.2 C)     Temp Source 06/17/16 1354 Oral     SpO2 06/17/16 1354 95 %     Weight 06/17/16 1354 190 lb (86.2 kg)     Height 06/17/16 1354 5\' 10"  (1.778 m)     Head Circumference --      Peak Flow --      Pain Score 06/17/16 1353 7     Pain Loc --      Pain Edu? --      Excl. in GC? --     Vital signs reviewed, nursing assessments reviewed.   Constitutional:   Alert and oriented. Well appearing and in no distress.in handcuffs Eyes:   No scleral icterus. No conjunctival pallor. EOMI.  No nystagmus. ENT   Head:   Normocephalic and atraumatic.   Nose:   No congestion/rhinnorhea.    .    Neck:   No meningismus. Full ROM. No midline tenderness Hematological/Lymphatic/Immunilogical:   No cervical lymphadenopathy. Cardiovascular:   RRR. Symmetric bilateral radial  and DP pulses.  No murmurs.  Respiratory:   Normal respiratory effort without tachypnea/retractions. Breath sounds are clear and equal bilaterally. No wheezes/rales/rhonchi. Gastrointestinal:   Soft and nontender. Non distended. There is no CVA tenderness.  No rebound, rigidity, or guarding. Genitourinary:   deferred Musculoskeletal:   Normal range of motion in all extremities. No joint effusions.  No lower extremity tenderness.  No edema.no midline spinal tenderness. The handcuff on the right wrist is rotated sideways to the width of the wrist, causing compression laterally. Handcuff is otherwise on pretty loose, so I rotated it so that it would not be causing any pressure on the skin and other wrist structures Neurologic:   Slurred speech.  CN  2-10 normal. Motor grossly intact. No gross focal neurologic deficits are appreciated.  Skin:    Skin is warm, dry and intact. No rash noted.  No petechiae, purpura, or bullae.  ____________________________________________    LABS (pertinent positives/negatives) (all labs ordered are listed, but only abnormal results are displayed) Labs Reviewed - No data to display ____________________________________________   EKG    ____________________________________________    RADIOLOGY  No results found.  ____________________________________________   PROCEDURES Procedures  ____________________________________________   INITIAL IMPRESSION / ASSESSMENT AND PLAN / ED COURSE  Pertinent labs & imaging results that were available during my care of the patient were reviewed by me and considered in my medical decision making (see chart for details).  Patient presents in police custody. Clinically appears to be intoxicated from alcohol opioids benzodiazepines or some other sedative. However, patient denies any sort of ingestion. He refuses any kind of lab draws or testing. In this case, we will observe the patient in the emergency department until he is clinically sober with normal speech, not tolerating oral intake, and ambulatory with steady gait. I see no evidence of acute traumatic injury. Low suspicion of neurologic or cardiopulmonary event or cardiovascular pathology. Don't think he is having ACS PE dissection AAA stroke or any acute infection.  Care the patient will be signed out to oncoming physician at 3:00 PM to continue monitoring.     ____________________________________________   FINAL CLINICAL IMPRESSION(S) / ED DIAGNOSES  Final diagnoses:  Intoxication by drug, uncomplicated (HCC)  Uncooperative behavior      New Prescriptions   No medications on file     Portions of this note were generated with dragon dictation software. Dictation errors may occur despite  best attempts at proofreading.    Sharman Cheek, MD 06/17/16 1440

## 2016-06-17 NOTE — Discharge Instructions (Signed)
See your doctor  Avoid drugs or alcohol   Return to ER if you have withdrawal, seizures, fever.
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
## 2016-06-17 NOTE — ED Provider Notes (Signed)
  Physical Exam  BP 125/78   Pulse 94   Temp 99 F (37.2 C) (Oral)   Resp 16   Ht 5\' 10"  (1.778 m)   Wt 86.2 kg (190 lb)   SpO2 95%   BMI 27.26 kg/m   Physical Exam  ED Course  Procedures  MDM Care assumed at 3 pm. Patient arrested but is too intoxicated to go to jail. Has refused blood work. He is hand cuffed to the bed. Sign out pending sobering up.   7:06 PM Patient ate food. Ambulated independently. Clinically sober. Stable to go to jail with police       Charlynne PanderYao, David Hsienta, MD 06/17/16 [phone]

## 2016-06-17 NOTE — ED Notes (Signed)
Pt given water to drink and peanut butter and crackers.  Pt awake and talking.

## 2016-06-17 NOTE — ED Notes (Signed)
Pt aroused by staff and refused supper tray and refused fluids
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
## 2016-08-08 ENCOUNTER — Emergency Department
Admission: EM | Admit: 2016-08-08 | Discharge: 2016-08-08 | Disposition: A | Discharge status: Home / Self Care | Attending: Emergency Medicine | Admitting: Emergency Medicine

## 2016-08-08 ENCOUNTER — Emergency Department

## 2016-08-08 ENCOUNTER — Encounter: Payer: Self-pay | Admitting: Emergency Medicine

## 2016-08-08 DIAGNOSIS — Z8673 Personal history of transient ischemic attack (TIA), and cerebral infarction without residual deficits: Secondary | ICD-10-CM | POA: Insufficient documentation

## 2016-08-08 DIAGNOSIS — W501XXA Accidental kick by another person, initial encounter: Secondary | ICD-10-CM | POA: Insufficient documentation

## 2016-08-08 DIAGNOSIS — B1710 Acute hepatitis C without hepatic coma: Secondary | ICD-10-CM | POA: Insufficient documentation

## 2016-08-08 DIAGNOSIS — Y939 Activity, unspecified: Secondary | ICD-10-CM | POA: Insufficient documentation

## 2016-08-08 DIAGNOSIS — Y929 Unspecified place or not applicable: Secondary | ICD-10-CM | POA: Insufficient documentation

## 2016-08-08 DIAGNOSIS — Y998 Other external cause status: Secondary | ICD-10-CM | POA: Insufficient documentation

## 2016-08-08 DIAGNOSIS — E119 Type 2 diabetes mellitus without complications: Secondary | ICD-10-CM | POA: Insufficient documentation

## 2016-08-08 DIAGNOSIS — S20211A Contusion of right front wall of thorax, initial encounter: Secondary | ICD-10-CM | POA: Insufficient documentation

## 2016-08-08 DIAGNOSIS — Z87891 Personal history of nicotine dependence: Secondary | ICD-10-CM | POA: Insufficient documentation

## 2016-08-08 MED ORDER — IBUPROFEN 600 MG PO TABS: 600.0000 mg | ORAL_TABLET | Freq: Three times a day (TID) | ORAL | 0 refills | Status: DC | PRN

## 2016-08-08 MED ORDER — TRAMADOL HCL 50 MG PO TABS: 50.0000 mg | ORAL_TABLET | Freq: Four times a day (QID) | ORAL | 0 refills | Status: DC | PRN
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
## 2016-08-08 NOTE — Discharge Instructions (Signed)
[facility] if any continued problems. Ibuprofen and tramadol as needed and only as directed.  Ice to your ribs for comfort

## 2017-07-22 ENCOUNTER — Ambulatory Visit: Payer: Self-pay

## 2017-08-03 ENCOUNTER — Ambulatory Visit: Payer: Medicaid Other | Admitting: Family Medicine

## 2017-08-03 VITALS — BP 153/88 | HR 81 | Temp 98.1°F | Wt 202.2 lb

## 2017-08-03 DIAGNOSIS — R350 Frequency of micturition: Secondary | ICD-10-CM

## 2017-08-03 DIAGNOSIS — H547 Unspecified visual loss: Secondary | ICD-10-CM

## 2017-08-03 DIAGNOSIS — Z8673 Personal history of transient ischemic attack (TIA), and cerebral infarction without residual deficits: Secondary | ICD-10-CM

## 2017-08-03 DIAGNOSIS — N401 Enlarged prostate with lower urinary tract symptoms: Secondary | ICD-10-CM

## 2017-08-03 DIAGNOSIS — G8929 Other chronic pain: Secondary | ICD-10-CM

## 2017-08-03 DIAGNOSIS — Z8619 Personal history of other infectious and parasitic diseases: Secondary | ICD-10-CM | POA: Insufficient documentation

## 2017-08-03 DIAGNOSIS — M545 Low back pain: Secondary | ICD-10-CM

## 2017-08-03 DIAGNOSIS — E119 Type 2 diabetes mellitus without complications: Secondary | ICD-10-CM | POA: Insufficient documentation

## 2017-08-03 DIAGNOSIS — M5441 Lumbago with sciatica, right side: Secondary | ICD-10-CM

## 2017-08-03 DIAGNOSIS — R030 Elevated blood-pressure reading, without diagnosis of hypertension: Secondary | ICD-10-CM

## 2017-08-03 DIAGNOSIS — M5412 Radiculopathy, cervical region: Secondary | ICD-10-CM | POA: Insufficient documentation

## 2017-08-03 MED ORDER — RANITIDINE HCL 150 MG PO TABS: 150.0000 mg | ORAL_TABLET | Freq: Two times a day (BID) | ORAL | 2 refills | Status: DC

## 2017-08-03 MED ORDER — TAMSULOSIN HCL 0.4 MG PO CAPS: 0.4000 mg | ORAL_CAPSULE | Freq: Every day | ORAL | 2 refills | Status: DC

## 2017-08-03 NOTE — Assessment & Plan Note (Addendum)
Refer to ortho; explained that we do not treat chronic pain conditions here and will not prescribe tramadol or stronger medicine

## 2017-08-03 NOTE — Assessment & Plan Note (Signed)
successfully treated with interferon

## 2017-08-03 NOTE — Assessment & Plan Note (Signed)
Refer to ortho spine surgeon

## 2017-08-03 NOTE — Patient Instructions (Addendum)
Please schedule an appointment with the dental clinic here Return in two weeks for high blood pressure and other health issues Do use over-the-counter medicine per package instructions   DASH Eating Plan DASH stands for "Dietary Approaches to Stop Hypertension." The DASH eating plan is a healthy eating plan that has been shown to reduce high blood pressure (hypertension). It may also reduce your risk for type 2 diabetes, heart disease, and stroke. The DASH eating plan may also help with weight loss. What are tips for following this plan? General guidelines  Avoid eating more than 2,300 mg (milligrams) of salt (sodium) a day. If you have hypertension, you may need to reduce your sodium intake to 1,500 mg a day.  Limit alcohol intake to no more than 1 drink a day for nonpregnant women and 2 drinks a day for men. One drink equals 12 oz of beer, 5 oz of wine, or 1 oz of hard liquor.  Work with your health care provider to maintain a healthy body weight or to lose weight. Ask what an ideal weight is for you.  Get at least 30 minutes of exercise that causes your heart to beat faster (aerobic exercise) most days of the week. Activities may include walking, swimming, or biking.  Work with your health care provider or diet and nutrition specialist (dietitian) to adjust your eating plan to your individual calorie needs. Reading food labels  Check food labels for the amount of sodium per serving. Choose foods with less than 5 percent of the Daily Value of sodium. Generally, foods with less than 300 mg of sodium per serving fit into this eating plan.  To find whole grains, look for the word "whole" as the first word in the ingredient list. Shopping  Buy products labeled as "low-sodium" or "no salt added."  Buy fresh foods. Avoid canned foods and premade or frozen meals. Cooking  Avoid adding salt when cooking. Use salt-free seasonings or herbs instead of table salt or sea salt. Check with your  health care provider or pharmacist before using salt substitutes.  Do not fry foods. Cook foods using healthy methods such as baking, boiling, grilling, and broiling instead.  Cook with heart-healthy oils, such as olive, canola, soybean, or sunflower oil. Meal planning   Eat a balanced diet that includes: ? 5 or more servings of fruits and vegetables each day. At each meal, try to fill half of your plate with fruits and vegetables. ? Up to 6-8 servings of whole grains each day. ? Less than 6 oz of lean meat, poultry, or fish each day. A 3-oz serving of meat is about the same size as a deck of cards. One egg equals 1 oz. ? 2 servings of low-fat dairy each day. ? A serving of nuts, seeds, or beans 5 times each week. ? Heart-healthy fats. Healthy fats called Omega-3 fatty acids are found in foods such as flaxseeds and coldwater fish, like sardines, salmon, and mackerel.  Limit how much you eat of the following: ? Canned or prepackaged foods. ? Food that is high in trans fat, such as fried foods. ? Food that is high in saturated fat, such as fatty meat. ? Sweets, desserts, sugary drinks, and other foods with added sugar. ? Full-fat dairy products.  Do not salt foods before eating.  Try to eat at least 2 vegetarian meals each week.  Eat more home-cooked food and less restaurant, buffet, and fast food.  When eating at a restaurant, ask that your  food be prepared with less salt or no salt, if possible. What foods are recommended? The items listed may not be a complete list. Talk with your dietitian about what dietary choices are best for you. Grains Whole-grain or whole-wheat bread. Whole-grain or whole-wheat pasta. Brown rice. Orpah Cobb. Bulgur. Whole-grain and low-sodium cereals. Pita bread. Low-fat, low-sodium crackers. Whole-wheat flour tortillas. Vegetables Fresh or frozen vegetables (raw, steamed, roasted, or grilled). Low-sodium or reduced-sodium tomato and vegetable juice.  Low-sodium or reduced-sodium tomato sauce and tomato paste. Low-sodium or reduced-sodium canned vegetables. Fruits All fresh, dried, or frozen fruit. Canned fruit in natural juice (without added sugar). Meat and other protein foods Skinless chicken or [place]. Ground chicken or [place]. Pork with fat trimmed off. Fish and seafood. Egg whites. Dried beans, peas, or lentils. Unsalted nuts, nut butters, and seeds. Unsalted canned beans. Lean cuts of beef with fat trimmed off. Low-sodium, lean deli meat. Dairy Low-fat (1%) or fat-free (skim) milk. Fat-free, low-fat, or reduced-fat cheeses. Nonfat, low-sodium ricotta or cottage cheese. Low-fat or nonfat yogurt. Low-fat, low-sodium cheese. Fats and oils Soft margarine without trans fats. Vegetable oil. Low-fat, reduced-fat, or light mayonnaise and salad dressings (reduced-sodium). Canola, safflower, olive, soybean, and sunflower oils. Avocado. Seasoning and other foods Herbs. Spices. Seasoning mixes without salt. Unsalted popcorn and pretzels. Fat-free sweets. What foods are not recommended? The items listed may not be a complete list. Talk with your dietitian about what dietary choices are best for you. Grains Baked goods made with fat, such as croissants, muffins, or some breads. Dry pasta or rice meal packs. Vegetables Creamed or fried vegetables. Vegetables in a cheese sauce. Regular canned vegetables (not low-sodium or reduced-sodium). Regular canned tomato sauce and paste (not low-sodium or reduced-sodium). Regular tomato and vegetable juice (not low-sodium or reduced-sodium). Rosita Fire. Olives. Fruits Canned fruit in a light or heavy syrup. Fried fruit. Fruit in cream or butter sauce. Meat and other protein foods Fatty cuts of meat. Ribs. Fried meat. Tomasa Blase. Sausage. Bologna and other processed lunch meats. Salami. Fatback. Hotdogs. Bratwurst. Salted nuts and seeds. Canned beans with added salt. Canned or smoked fish. Whole eggs or egg yolks. Chicken  or [place] with skin. Dairy Whole or 2% milk, cream, and half-and-half. Whole or full-fat cream cheese. Whole-fat or sweetened yogurt. Full-fat cheese. Nondairy creamers. Whipped toppings. Processed cheese and cheese spreads. Fats and oils Butter. Stick margarine. Lard. Shortening. Ghee. Bacon fat. Tropical oils, such as coconut, palm kernel, or palm oil. Seasoning and other foods Salted popcorn and pretzels. Onion salt, garlic salt, seasoned salt, table salt, and sea salt. Worcestershire sauce. Tartar sauce. Barbecue sauce. Teriyaki sauce. Soy sauce, including reduced-sodium. Steak sauce. Canned and packaged gravies. Fish sauce. Oyster sauce. Cocktail sauce. Horseradish that you find on the shelf. Ketchup. Mustard. Meat flavorings and tenderizers. Bouillon cubes. Hot sauce and Tabasco sauce. Premade or packaged marinades. Premade or packaged taco seasonings. Relishes. Regular salad dressings. Where to find more information:  National Heart, Lung, and Blood Institute: PopSteam.is  American Heart Association: www.heart.org Summary  The DASH eating plan is a healthy eating plan that has been shown to reduce high blood pressure (hypertension). It may also reduce your risk for type 2 diabetes, heart disease, and stroke.  With the DASH eating plan, you should limit salt (sodium) intake to 2,300 mg a day. If you have hypertension, you may need to reduce your sodium intake to 1,500 mg a day.  When on the DASH eating plan, aim to eat more fresh fruits and vegetables,  whole grains, lean proteins, low-fat dairy, and heart-healthy fats.  Work with your health care provider or diet and nutrition specialist (dietitian) to adjust your eating plan to your individual calorie needs. This information is not intended to replace advice given to you by your health care provider. Make sure you discuss any questions you have with your health care provider. Document Released: 01/01/2011 Document Revised:  01/06/2016 Document Reviewed: 01/06/2016 Elsevier Interactive Patient Education  2018 [employer].  Alcohol Use Disorder Alcohol use disorder is when your drinking disrupts your daily life. When you have this condition, you drink too much alcohol and you cannot control your drinking. Alcohol use disorder can cause serious problems with your physical health. It can affect your brain, heart, liver, pancreas, immune system, stomach, and intestines. Alcohol use disorder can increase your risk for certain cancers and cause problems with your mental health, such as depression, anxiety, psychosis, delirium, and dementia. People with this disorder risk hurting themselves and others. What are the causes? This condition is caused by drinking too much alcohol over time. It is not caused by drinking too much alcohol only one or two times. Some people with this condition drink alcohol to cope with or escape from negative life events. Others drink to relieve pain or symptoms of mental illness. What increases the risk? You are more likely to develop this condition if:  You have a family history of alcohol use disorder.  Your culture encourages drinking to the point of intoxication, or makes alcohol easy to get.  You had a mood or conduct disorder in childhood.  You have been a victim of abuse.  You are an adolescent and: ? You have poor grades or difficulties in school. ? Your caregivers do not talk to you about saying no to alcohol, or supervise your activities. ? You are impulsive or you have trouble with self-control.  What are the signs or symptoms? Symptoms of this condition include:  Drinkingmore than you want to.  Drinking for longer than you want to.  Trying several times to drink less or to control your drinking.  Spending a lot of time getting alcohol, drinking, or recovering from drinking.  Craving alcohol.  Having problems at work, at school, or at home due to drinking.  Having  problems in relationships due to drinking.  Drinking when it is dangerous to drink, such as before driving a car.  Continuing to drink even though you know you might have a physical or mental problem related to drinking.  Needing more and more alcohol to get the same effect you want from the alcohol (building up tolerance).  Having symptoms of withdrawal when you stop drinking. Symptoms of withdrawal include: ? Fatigue. ? Nightmares. ? Trouble sleeping. ? Depression. ? Anxiety. ? Fever. ? Seizures. ? Severe confusion. ? Feeling or seeing things that are not there (hallucinations). ? Tremors. ? Rapid heart rate. ? Rapid breathing. ? High blood pressure.  Drinking to avoid symptoms of withdrawal.  How is this diagnosed? This condition is diagnosed with an assessment. Your health care provider may start the assessment by asking three or four questions about your drinking. Your health care provider may perform a physical exam or do lab tests to see if you have physical problems resulting from alcohol use. She or he may refer you to a mental health professional for evaluation. How is this treated? Some people with alcohol use disorder are able to reduce their alcohol use to low-risk levels. Others need to  completely quit drinking alcohol. When necessary, mental health professionals with specialized training in substance use treatment can help. Your health care provider can help you decide how severe your alcohol use disorder is and what type of treatment you need. The following forms of treatment are available:  Detoxification. Detoxification involves quitting drinking and using prescription medicines within the first week to help lessen withdrawal symptoms. This treatment is important for people who have had withdrawal symptoms before and for heavy drinkers who are likely to have withdrawal symptoms. Alcohol withdrawal can be dangerous, and in severe cases, it can cause death.  Detoxification may be provided in a home, community, or primary care setting, or in a hospital or substance use treatment facility.  Counseling. This treatment is also called talk therapy. It is provided by substance use treatment counselors. A counselor can address the reasons you use alcohol and suggest ways to keep you from drinking again or to prevent problem drinking. The goals of talk therapy are to: ? Find healthy activities and ways for you to cope with stress. ? Identify and avoid the things that trigger your alcohol use. ? Help you learn how to handle cravings.  Medicines.Medicines can help treat alcohol use disorder by: ? Decreasing alcohol cravings. ? Decreasing the positive feeling you have when you drink alcohol. ? Causing an uncomfortable physical reaction when you drink alcohol (aversion therapy).  Support groups. Support groups are led by people who have quit drinking. They provide emotional support, advice, and guidance.  These forms of treatment are often combined. Some people with this condition benefit from a combination of treatments provided by specialized substance use treatment centers. Follow these instructions at home:  Take over-the-counter and prescription medicines only as told by your health care provider.  Check with your health care provider before starting any new medicines.  Ask friends and family members not to offer you alcohol.  Avoid situations where alcohol is served, including gatherings where others are drinking alcohol.  Create a plan for what to do when you are tempted to use alcohol.  Find hobbies or activities that you enjoy that do not include alcohol.  Keep all follow-up visits as told by your health care provider. This is important. How is this prevented?  If you drink, limit alcohol intake to no more than 1 drink a day for nonpregnant women and 2 drinks a day for men. One drink equals 12 oz of beer, 5 oz of wine, or 1 oz of hard  liquor.  If you have a mental health condition, get treatment and support.  Do not give alcohol to adolescents.  If you are an adolescent: ? Do not drink alcohol. ? Do not be afraid to say no if someone offers you alcohol. Speak up about why you do not want to drink. You can be a positive role model for your friends and set a good example for those around you by not drinking alcohol. ? If your friends drink, spend time with others who do not drink alcohol. Make new friends who do not use alcohol. ? Find healthy ways to manage stress and emotions, such as meditation or deep breathing, exercise, spending time in nature, listening to music, or talking with a trusted friend or family member. Contact a health care provider if:  You are not able to take your medicines as told.  Your symptoms get worse.  You return to drinking alcohol (relapse) and your symptoms get worse. Get help right away if:  You have thoughts about hurting yourself or others. If you ever feel like you may hurt yourself or others, or have thoughts about taking your own life, get help right away. You can go to your nearest emergency department or call:  Your local emergency services (911 in the [place]  A suicide crisis helpline, such as the National Suicide Prevention Lifeline at 937-028-67881-319 362 7516. This is open 24 hours a day.  Summary  Alcohol use disorder is when your drinking disrupts your daily life. When you have this condition, you drink too much alcohol and you cannot control your drinking.  Treatment may include detoxification, counseling, medicine, and support groups.  Ask friends and family members not to offer you alcohol. Avoid situations where alcohol is served.  Get help right away if you have thoughts about hurting yourself or others. This information is not intended to replace advice given to you by your health care provider. Make sure you discuss any questions you have with your health care  provider. Document Released: 02/20/2004 Document Revised: 10/10/2015 Document Reviewed: 10/10/2015 Elsevier Interactive Patient Education  [employer]2018 Elsevier Inc.

## 2017-08-03 NOTE — Progress Notes (Signed)
o  BP (!) 153/88   Pulse 81   Temp 98.1 F (36.7 C)   Wt 202 lb 3.2 oz (91.7 kg)   BMI 27.81 kg/m    Subjective:    Patient ID: Steven Gardner, male    DOB: 1960-11-06, 57 y.o.   MRN: [id number]  HPI: Steven Gardner is a 57 y.o. male  Chief Complaint  Patient presents with  . Neck Pain  . Dental Problem    HPI Patient is here to establish care  Type 2 diabetes mellitus; tested in January and was back to normal; was homeless for awhile, inadequate food for a while; transportation is an issue but has home right now; MGF had diabetes (as well as prostate cancer)   Stroke; incarcerated, in [place]; 57 years old, memory retention and speech; ongoing issues with speech and balance; taking "ibuprofen like candy" because of the neck and shoulder pain; not taking aspirin; no hx of bleeding on the brain, no hx of stomach; very mild stroke he says; father had 3 strokes (he has passed); HDL was a little low too  BP up when stressed or in pain; used to drink heavily; hard liquor; has 25 ounce beers, two last night; less than 14 per week now  Hepatitis C; treated with interferon and it was rough with treatment but cured; checked in January, not active  Dental problem  Neck pain; cervical spine xrays from May 2018 showed multilevel degenerative disc disease and post-op changes including ACDF at C5-C7 with complete bony fusion; mild multilevel facet arthropathy; Jan 02, 2000; Dr. Daisey Must, spine center [facility]; has atrophy of muscles in both shoulders and forearms; right shoulder, upper arm, and he is right-handed; no recent physical therapy; he tried therapy right after the surgery, they said he might need additional surgery; applied for disability and saw the disability doctor and has issue with lower back too; worked with hands over head; no fam hx He was taking 100 mg of tramadol  He was taking flomax for prostate; he has benign prostatic hypertrophy; was started  on flomax in his 31's; he has not had any flomax since 2013  Alcohol use; social hx says excessive use of alcohol  No flowsheet data found.  Relevant past medical, surgical, family and social history reviewed Past Medical History:  Diagnosis Date  . Diabetes mellitus without complication ([place])   . Hepatitis C   . Paresthesias   . Spinal stenosis   . Stroke [facility])    Past Surgical History:  Procedure Laterality Date  . ELBOW SURGERY    . KNEE SURGERY    . NECK SURGERY    . SPINE SURGERY     No family history on file.    Social History   Tobacco Use  . Smoking status: Former Research scientist (life sciences)  . Smokeless tobacco: Never Used  Substance Use Topics  . Alcohol use: Yes    Comment: 8-10 40oz/beer daily  . Drug use: No    Interim medical history since last visit reviewed. Allergies and medications reviewed  Review of Systems Per HPI unless specifically indicated above     Objective:    BP (!) 153/88   Pulse 81   Temp 98.1 F (36.7 C)   Wt 202 lb 3.2 oz (91.7 kg)   BMI 27.81 kg/m   Wt Readings from Last 3 Encounters:  08/03/17 202 lb 3.2 oz (91.7 kg)  08/08/16 190 lb (86.2 kg)  06/17/16 190 lb (86.2 kg)  Physical Exam  Constitutional: He appears well-developed and well-nourished. No distress.  HENT:  Head: Normocephalic and atraumatic.  Eyes: EOM are normal. No scleral icterus.  Neck: No thyromegaly present.  Cardiovascular: Normal rate and regular rhythm.  Pulmonary/Chest: Effort normal and breath sounds normal.  Abdominal: Soft. Bowel sounds are normal. He exhibits no distension.  Musculoskeletal: He exhibits no edema.  Neurological: Coordination normal.  Skin: Skin is warm and dry. No pallor.  Psychiatric: He has a normal mood and affect. His behavior is normal. Judgment and thought content normal.      Assessment & Plan:   Problem List Items Addressed This Visit      Digestive   Hx of hepatitis C    successfully treated with interferon         Endocrine   Type 2 diabetes mellitus ([place]) - Primary   Relevant Orders   CBC w/Diff (Completed)   Comp Met (CMET) (Completed)   Lipid Profile (Completed)   Hemoglobin A1c (Completed)   Urine Microalbumin w/creat. ratio (Completed)     Nervous and Auditory   Cervical radiculopathy at C6    Refer to ortho spine surgeon      Relevant Orders   Ambulatory referral to Orthopedic Surgery     Genitourinary   Benign prostatic hyperplasia with urinary frequency    Refer to urologist; refill of flomax given      Relevant Medications   tamsulosin (FLOMAX) 0.4 MG CAPS capsule   Other Relevant Orders   Ambulatory referral to Urology     Other   Chronic lower back pain    Refer to ortho; explained that we do not treat chronic pain conditions here and will not prescribe tramadol or stronger medicine      Relevant Orders   Ambulatory referral to Orthopedic Surgery    Other Visit Diagnoses    Hx of completed stroke       Relevant Orders   CBC w/Diff (Completed)   Comp Met (CMET) (Completed)   Lipid Profile (Completed)   Hemoglobin A1c (Completed)   Urine Microalbumin w/creat. ratio (Completed)   Visual impairment       Relevant Orders   Ambulatory referral to Ophthalmology   Elevated blood pressure reading       return for recheck and f/u; DASH guidelines recommended       Follow up plan: No follow-ups on file.  An after-visit summary was printed and given to the patient at [facility].  Please see the patient instructions which may contain other information and recommendations beyond what is mentioned above in the assessment and plan.  Meds ordered this encounter  Medications  . tamsulosin (FLOMAX) 0.4 MG CAPS capsule    Sig: Take 1 capsule (0.4 mg total) by mouth daily.    Dispense:  30 capsule    Refill:  2  . ranitidine (ZANTAC) 150 MG tablet    Sig: Take 1 tablet (150 mg total) by mouth 2 (two) times daily.    Dispense:  60 tablet    Refill:  2    Orders Placed  This Encounter  Procedures  . CBC w/Diff  . Comp Met (CMET)  . Lipid Profile  . Hemoglobin A1c  . Urine Microalbumin w/creat. ratio  . Ambulatory referral to Urology  . Ambulatory referral to Orthopedic Surgery  . Ambulatory referral to Ophthalmology

## 2017-08-04 LAB — COMPREHENSIVE METABOLIC PANEL
ALT: 38 IU/L (ref 0–44)
AST: 56 IU/L — AB (ref 0–40)
Albumin/Globulin Ratio: 1.5 (ref 1.2–2.2)
Albumin: 4.8 g/dL (ref 3.5–5.5)
Alkaline Phosphatase: 67 IU/L (ref 39–117)
BUN/Creatinine Ratio: 12 (ref 9–20)
BUN: 14 mg/dL (ref 6–24)
Bilirubin Total: 1.1 mg/dL (ref 0.0–1.2)
CALCIUM: 10 mg/dL (ref 8.7–10.2)
CO2: 26 mmol/L (ref 20–29)
CREATININE: 1.16 mg/dL (ref 0.76–1.27)
Chloride: 99 mmol/L (ref 96–106)
GFR calc Af Amer: 81 mL/min/{1.73_m2} (ref >59)
GFR, EST NON AFRICAN AMERICAN: 70 mL/min/{1.73_m2} (ref >59)
GLOBULIN, TOTAL: 3.1 g/dL (ref 1.5–4.5)
GLUCOSE: 89 mg/dL (ref 65–99)
Potassium: 4.6 mmol/L (ref 3.5–5.2)
Sodium: 141 mmol/L (ref 134–144)
Total Protein: 7.9 g/dL (ref 6.0–8.5)

## 2017-08-04 LAB — HEMOGLOBIN A1C
Est. average glucose Bld gHb Est-mCnc: 91 mg/dL
Hgb A1c MFr Bld: 4.8 % (ref 4.8–5.6)

## 2017-08-04 LAB — LIPID PANEL
CHOL/HDL RATIO: 2.5 ratio (ref 0.0–5.0)
Cholesterol, Total: 204 mg/dL — ABNORMAL HIGH (ref 100–199)
HDL: 82 mg/dL (ref >39)
LDL CALC: 109 mg/dL — AB (ref 0–99)
TRIGLYCERIDES: 63 mg/dL (ref 0–149)
VLDL Cholesterol Cal: 13 mg/dL (ref 5–40)

## 2017-08-04 LAB — CBC WITH DIFFERENTIAL/PLATELET
Basophils Absolute: 0 10*3/uL (ref 0.0–0.2)
Basos: 1 %
EOS (ABSOLUTE): 0.1 10*3/uL (ref 0.0–0.4)
EOS: 1 %
HEMATOCRIT: 42.3 % (ref 37.5–51.0)
Hemoglobin: 14.5 g/dL (ref 13.0–17.7)
IMMATURE GRANULOCYTES: 0 %
Immature Grans (Abs): 0 10*3/uL (ref 0.0–0.1)
LYMPHS: 31 %
Lymphocytes Absolute: 2 10*3/uL (ref 0.7–3.1)
MCH: 32.9 pg (ref 26.6–33.0)
MCHC: 34.3 g/dL (ref 31.5–35.7)
MCV: 96 fL (ref 79–97)
MONOCYTES: 17 %
MONOS ABS: 1.1 10*3/uL — AB (ref 0.1–0.9)
NEUTROS PCT: 50 %
Neutrophils Absolute: 3.2 10*3/uL (ref 1.4–7.0)
PLATELETS: 151 10*3/uL (ref 150–450)
RBC: 4.41 x10E6/uL (ref 4.14–5.80)
RDW: 13.8 % (ref 12.3–15.4)
WBC: 6.4 10*3/uL (ref 3.4–10.8)

## 2017-08-04 LAB — MICROALBUMIN / CREATININE URINE RATIO
Creatinine, Urine: 92.6 mg/dL
Microalb/Creat Ratio: <3.2 mg/g creat (ref 0.0–30.0)
Microalbumin, Urine: <3 ug/mL

## 2017-08-12 ENCOUNTER — Ambulatory Visit: Payer: Medicaid Other | Admitting: Ophthalmology

## 2017-08-12 DIAGNOSIS — R350 Frequency of micturition: Secondary | ICD-10-CM

## 2017-08-12 DIAGNOSIS — N401 Enlarged prostate with lower urinary tract symptoms: Secondary | ICD-10-CM | POA: Insufficient documentation

## 2017-08-12 NOTE — Assessment & Plan Note (Signed)
Refer to urologist; refill of flomax given

## 2017-08-18 ENCOUNTER — Ambulatory Visit: Payer: Medicaid Other | Admitting: Specialist

## 2017-08-18 DIAGNOSIS — M47812 Spondylosis without myelopathy or radiculopathy, cervical region: Secondary | ICD-10-CM

## 2017-08-18 NOTE — Progress Notes (Signed)
   Subjective:    Patient ID: Steven Gardner, male    DOB: Jan 22, 1961, 57 y.o.   MRN: [id number]  HPI 57 yr old who has had a previous ACDF of C5/C7. Now he has chronic and worsening pain on rt side of neck in to his rt arm.  He is in the process for applying for SSDI for both his neck and low back.     Review of Systems     Objective:   Physical Exam Obvious atrophy of the rt deltoid. Circumfrential measurements of the arms are 32 cm on rt. 31.5 cm on left; the forearms are 28 cm on rt and 28.5 on left.  MMT shows he has slight weakness of the rt biceps.  I have reviewed the CT evaluation from 05/2016.         Assessment & Plan:  IMP: My thought is that he has cervical spondylosis with possible adjacent segment disease.  [facility] I will order a MRI as I am certain they will need one. Rtc in 2 weeks.

## 2017-08-26 ENCOUNTER — Ambulatory Visit: Payer: Medicaid Other | Admitting: Adult Health Nurse Practitioner

## 2017-08-26 VITALS — BP 124/86 | HR 70 | Temp 98.2°F | Ht 72.0 in | Wt 205.7 lb

## 2017-08-26 DIAGNOSIS — E119 Type 2 diabetes mellitus without complications: Secondary | ICD-10-CM

## 2017-08-26 NOTE — Progress Notes (Signed)
  Patient: Steven Gardner Male    DOB: November 07, 1960   57 y.o.   MRN: [id number] Visit Date: 08/26/2017  Today's Provider: Jacelyn Pieah Doles-Johnson, NP   Chief Complaint  Patient presents with  . Follow-up  . Dental Pain    back, top left and bottom left (midway)  . Neck Pain    neck pain, nerve damage   Subjective:    HPI   Here for lab review.   MRI scheduled for tomorrow then FU with Dr. Justice RocherFossier.     No Known Allergies Previous Medications   IBUPROFEN (ADVIL,MOTRIN) 600 MG TABLET    Take 1 tablet (600 mg total) by mouth every 8 (eight) hours as needed.   RANITIDINE (ZANTAC) 150 MG TABLET    Take 1 tablet (150 mg total) by mouth 2 (two) times daily.   TAMSULOSIN (FLOMAX) 0.4 MG CAPS CAPSULE    Take 1 capsule (0.4 mg total) by mouth daily.    Review of Systems  All other systems reviewed and are negative.   Social History   Tobacco Use  . Smoking status: Former Games developermoker  . Smokeless tobacco: Never Used  Substance Use Topics  . Alcohol use: Yes    Comment: 8-10 40oz/beer daily   Objective:   BP 124/86 (BP Location: Left Arm, Patient Position: Sitting)   Pulse 70   Temp 98.2 F (36.8 C)   Ht 6' (1.829 m)   Wt 205 lb 11.2 oz (93.3 kg)   BMI 27.90 kg/m   Physical Exam  Constitutional: He appears well-developed and well-nourished.  Cardiovascular: Normal rate and regular rhythm.  Pulmonary/Chest: Effort normal and breath sounds normal.  Abdominal: Soft. Bowel sounds are normal.        Assessment & Plan:         Referral to dental clinic.   FU with Dr. Justice RocherFossier as indicated.   FU in 6 months for routine care.   Jacelyn Pieah Doles-Johnson, NP   [facility] [place]

## 2017-08-27 ENCOUNTER — Ambulatory Visit
Admission: RE | Admit: 2017-08-27 | Discharge: 2017-08-27 | Disposition: A | Discharge status: Home / Self Care | Payer: Self-pay | Source: Ambulatory Visit | Attending: Specialist | Admitting: Specialist

## 2017-08-27 DIAGNOSIS — M47812 Spondylosis without myelopathy or radiculopathy, cervical region: Secondary | ICD-10-CM

## 2017-09-08 ENCOUNTER — Ambulatory Visit: Payer: Medicaid Other | Admitting: Specialist

## 2017-09-08 ENCOUNTER — Telehealth: Payer: Self-pay

## 2017-09-08 DIAGNOSIS — Z0000 Encounter for general adult medical examination without abnormal findings: Secondary | ICD-10-CM

## 2017-09-08 NOTE — Telephone Encounter (Signed)
[facility] stated pt needs a eye xray before they can complete the MRI. Xray order put in.

## 2017-09-17 ENCOUNTER — Ambulatory Visit
Admission: RE | Admit: 2017-09-17 | Discharge: 2017-09-17 | Disposition: A | Discharge status: Home / Self Care | Payer: Self-pay | Source: Ambulatory Visit | Attending: Specialist | Admitting: Specialist

## 2017-09-17 DIAGNOSIS — Z1389 Encounter for screening for other disorder: Secondary | ICD-10-CM | POA: Insufficient documentation

## 2017-09-17 DIAGNOSIS — Z0000 Encounter for general adult medical examination without abnormal findings: Secondary | ICD-10-CM

## 2017-11-07 ENCOUNTER — Observation Stay
Admission: EM | Admit: 2017-11-07 | Discharge: 2017-11-09 | Disposition: A | Discharge status: Home / Self Care | Payer: Medicaid Other | Attending: Family Medicine | Admitting: Family Medicine

## 2017-11-07 ENCOUNTER — Other Ambulatory Visit: Payer: Self-pay

## 2017-11-07 ENCOUNTER — Encounter: Payer: Self-pay | Admitting: Emergency Medicine

## 2017-11-07 DIAGNOSIS — F10239 Alcohol dependence with withdrawal, unspecified: Secondary | ICD-10-CM | POA: Diagnosis not present

## 2017-11-07 DIAGNOSIS — R350 Frequency of micturition: Secondary | ICD-10-CM | POA: Diagnosis not present

## 2017-11-07 DIAGNOSIS — Z87891 Personal history of nicotine dependence: Secondary | ICD-10-CM | POA: Insufficient documentation

## 2017-11-07 DIAGNOSIS — B182 Chronic viral hepatitis C: Secondary | ICD-10-CM | POA: Diagnosis not present

## 2017-11-07 DIAGNOSIS — K2970 Gastritis, unspecified, without bleeding: Secondary | ICD-10-CM | POA: Insufficient documentation

## 2017-11-07 DIAGNOSIS — K920 Hematemesis: Secondary | ICD-10-CM | POA: Diagnosis not present

## 2017-11-07 DIAGNOSIS — K852 Alcohol induced acute pancreatitis without necrosis or infection: Secondary | ICD-10-CM

## 2017-11-07 DIAGNOSIS — N401 Enlarged prostate with lower urinary tract symptoms: Secondary | ICD-10-CM | POA: Diagnosis not present

## 2017-11-07 DIAGNOSIS — R1110 Vomiting, unspecified: Secondary | ICD-10-CM | POA: Diagnosis present

## 2017-11-07 DIAGNOSIS — R1010 Upper abdominal pain, unspecified: Secondary | ICD-10-CM

## 2017-11-07 DIAGNOSIS — K859 Acute pancreatitis without necrosis or infection, unspecified: Secondary | ICD-10-CM | POA: Diagnosis not present

## 2017-11-07 DIAGNOSIS — D696 Thrombocytopenia, unspecified: Secondary | ICD-10-CM | POA: Diagnosis not present

## 2017-11-07 DIAGNOSIS — K2900 Acute gastritis without bleeding: Secondary | ICD-10-CM

## 2017-11-07 DIAGNOSIS — E119 Type 2 diabetes mellitus without complications: Secondary | ICD-10-CM | POA: Diagnosis not present

## 2017-11-07 DIAGNOSIS — I69351 Hemiplegia and hemiparesis following cerebral infarction affecting right dominant side: Secondary | ICD-10-CM | POA: Insufficient documentation

## 2017-11-07 LAB — URINALYSIS, COMPLETE (UACMP) WITH MICROSCOPIC
Bacteria, UA: NONE SEEN
Bilirubin Urine: NEGATIVE
Glucose, UA: NEGATIVE mg/dL
Hgb urine dipstick: NEGATIVE
KETONES UR: 20 mg/dL — AB
Leukocytes, UA: NEGATIVE
Nitrite: NEGATIVE
PH: 6 (ref 5.0–8.0)
PROTEIN: NEGATIVE mg/dL
Specific Gravity, Urine: 1.016 (ref 1.005–1.030)
Squamous Epithelial / LPF: NONE SEEN (ref 0–5)

## 2017-11-07 LAB — CBC
HCT: 41.4 % (ref 39.0–52.0)
Hemoglobin: 14.2 g/dL (ref 13.0–17.0)
MCH: 33.2 pg (ref 26.0–34.0)
MCHC: 34.3 g/dL (ref 30.0–36.0)
MCV: 96.7 fL (ref 80.0–100.0)
PLATELETS: 95 10*3/uL — AB (ref 150–400)
RBC: 4.28 MIL/uL (ref 4.22–5.81)
RDW: 13 % (ref 11.5–15.5)
WBC: 4.3 10*3/uL (ref 4.0–10.5)
nRBC: 0 % (ref 0.0–0.2)

## 2017-11-07 LAB — COMPREHENSIVE METABOLIC PANEL
ALK PHOS: 82 U/L (ref 38–126)
ALT: 169 U/L — ABNORMAL HIGH (ref 0–44)
ANION GAP: 17 — AB (ref 5–15)
AST: 478 U/L — ABNORMAL HIGH (ref 15–41)
Albumin: 4.8 g/dL (ref 3.5–5.0)
BILIRUBIN TOTAL: 1.3 mg/dL — AB (ref 0.3–1.2)
BUN: 7 mg/dL (ref 6–20)
CALCIUM: 9.3 mg/dL (ref 8.9–10.3)
CO2: 20 mmol/L — ABNORMAL LOW (ref 22–32)
Chloride: 103 mmol/L (ref 98–111)
Creatinine, Ser: 0.77 mg/dL (ref 0.61–1.24)
GFR calc Af Amer: >60 mL/min (ref >60)
GFR calc non Af Amer: >60 mL/min (ref >60)
GLUCOSE: 121 mg/dL — AB (ref 70–99)
Potassium: 3.8 mmol/L (ref 3.5–5.1)
Sodium: 140 mmol/L (ref 135–145)
TOTAL PROTEIN: 8.7 g/dL — AB (ref 6.5–8.1)

## 2017-11-07 LAB — PROTIME-INR
INR: 1.07
PROTHROMBIN TIME: 13.8 s (ref 11.4–15.2)

## 2017-11-07 LAB — GLUCOSE, CAPILLARY: Glucose-Capillary: 128 mg/dL — ABNORMAL HIGH (ref 70–99)

## 2017-11-07 LAB — LIPASE, BLOOD: Lipase: 156 U/L — ABNORMAL HIGH (ref 11–51)

## 2017-11-07 MED ORDER — SODIUM CHLORIDE 0.9 % IV BOLUS
1000.0000 mL | Freq: Once | INTRAVENOUS | Status: AC
  Administered 2017-11-07: 1000 mL via INTRAVENOUS

## 2017-11-07 MED ORDER — LORAZEPAM 2 MG/ML IJ SOLN
1.0000 mg | INTRAMUSCULAR | Status: DC | PRN
  Administered 2017-11-08: 1 mg via INTRAVENOUS
  Filled 2017-11-07: qty 1

## 2017-11-07 MED ORDER — THIAMINE HCL 100 MG/ML IJ SOLN
100.0000 mg | Freq: Every day | INTRAMUSCULAR | Status: DC
  Administered 2017-11-07 – 2017-11-08 (×2): 100 mg via INTRAVENOUS
  Filled 2017-11-07 (×3): qty 2

## 2017-11-07 MED ORDER — ADULT MULTIVITAMIN W/MINERALS CH
1.0000 | ORAL_TABLET | Freq: Every day | ORAL | Status: DC
  Administered 2017-11-09: 1 via ORAL
  Filled 2017-11-07: qty 1

## 2017-11-07 MED ORDER — ONDANSETRON 4 MG PO TBDP
4.0000 mg | ORAL_TABLET | Freq: Once | ORAL | Status: AC | PRN
  Administered 2017-11-07: 4 mg via ORAL
  Filled 2017-11-07: qty 1

## 2017-11-07 MED ORDER — FOLIC ACID 1 MG PO TABS
1.0000 mg | ORAL_TABLET | Freq: Every day | ORAL | Status: DC
  Administered 2017-11-09: 1 mg via ORAL
  Filled 2017-11-07: qty 1

## 2017-11-07 MED ORDER — LORAZEPAM 2 MG/ML IJ SOLN
2.0000 mg | INTRAMUSCULAR | Status: DC | PRN
  Administered 2017-11-07 – 2017-11-08 (×4): 2 mg via INTRAVENOUS
  Filled 2017-11-07 (×5): qty 1

## 2017-11-07 MED ORDER — SODIUM CHLORIDE 0.9 % IV SOLN
80.0000 mg | Freq: Once | INTRAVENOUS | Status: AC
  Administered 2017-11-07: 80 mg via INTRAVENOUS
  Filled 2017-11-07: qty 80

## 2017-11-07 MED ORDER — ONDANSETRON HCL 4 MG/2ML IJ SOLN
4.0000 mg | Freq: Four times a day (QID) | INTRAMUSCULAR | Status: DC | PRN
  Administered 2017-11-07 – 2017-11-08 (×2): 4 mg via INTRAVENOUS
  Filled 2017-11-07 (×3): qty 2

## 2017-11-07 MED ORDER — SODIUM CHLORIDE 0.9 % IV SOLN
8.0000 mg/h | INTRAVENOUS | Status: DC
  Administered 2017-11-07 – 2017-11-08 (×2): 8 mg/h via INTRAVENOUS
  Filled 2017-11-07 (×4): qty 80

## 2017-11-07 MED ORDER — LORAZEPAM 2 MG/ML IJ SOLN
2.0000 mg | Freq: Once | INTRAMUSCULAR | Status: AC
  Administered 2017-11-07: 2 mg via INTRAVENOUS
  Filled 2017-11-07: qty 1

## 2017-11-07 MED ORDER — HYDROMORPHONE HCL 1 MG/ML PO LIQD
0.5000 mg | ORAL | Status: DC | PRN
  Administered 2017-11-08: 0.5 mg via ORAL
  Filled 2017-11-07 (×3): qty 0.5

## 2017-11-07 MED ORDER — LORAZEPAM 1 MG PO TABS
1.0000 mg | ORAL_TABLET | ORAL | Status: DC | PRN
  Administered 2017-11-08 – 2017-11-09 (×4): 1 mg via ORAL
  Filled 2017-11-07 (×4): qty 1

## 2017-11-07 MED ORDER — DOCUSATE SODIUM 100 MG PO CAPS: 100.0000 mg | ORAL_CAPSULE | Freq: Two times a day (BID) | ORAL | Status: DC | PRN

## 2017-11-07 MED ORDER — VITAMIN B-1 100 MG PO TABS
100.0000 mg | ORAL_TABLET | Freq: Every day | ORAL | Status: DC
  Administered 2017-11-09: 100 mg via ORAL
  Filled 2017-11-07 (×2): qty 1

## 2017-11-07 NOTE — ED Triage Notes (Signed)
Here for vomiting X 2 today. Had tooth pulled one month ago and reports it started bleeding today.  He vomited food first then second had blood in it per pt after previous tooth site started bleeding. Can see blood clot to top of left upper gums. Pt had piece bacon stuck in gum and moved it out and that's when started bleeding. Still having nausea. C/o lower abdominal pain. Minimal if any active bleeding present at this time.   VSS

## 2017-11-07 NOTE — ED Notes (Signed)
Pt ran into ED up to nursing desk and reports he has been vomiting blood today. Pt then ran into bathroom and had audible vomiting in the bathroom. Pt in  Wheelchair at this time and taken to a triage room.

## 2017-11-07 NOTE — H&P (Signed)
[facility]   PATIENT NAME: Steven Gardner    MR#:  [id number]  DATE OF BIRTH:  1960-07-25  DATE OF ADMISSION:  11/07/2017  PRIMARY CARE PHYSICIAN: Larene Pickett, FNP   REQUESTING/REFERRING PHYSICIAN: Rip Harbour  CHIEF COMPLAINT:   Chief Complaint  Patient presents with  . Emesis    HISTORY OF PRESENT ILLNESS: Steven Gardner  is a 57 y.o. male with a known history of hepatitis C, paresthesia, spinal stenosis, stroke-has right-sided upper and lower extremity weakness since his stroke 4 years ago.  He was a heavy drinker before the stroke but since stroke he cut down, now drinks 6-7 beers daily. He had tooth extraction last month from where he bled for 4 to 5 days.  He is having severe pain at the site and since then he is taking 4 ibuprofen tablets in a day for last 1 month.  Since yesterday after eating some hard food, he started bleeding at his tooth extraction site again which he is swallowing all the time.  Today he had some pain in his lower chest or upper abdomen area and he vomited blood.  Concerned with this he came to emergency room. He is noted to have low platelet and slightly higher lipase level. Hemoglobin is stable.  He had some shaking and agitation in ER had given 1 injection Ativan which helped and he is comfortable.  PAST MEDICAL HISTORY:   Past Medical History:  Diagnosis Date  . Hepatitis C   . Paresthesias   . Spinal stenosis   . Stroke [facility])     PAST SURGICAL HISTORY:  Past Surgical History:  Procedure Laterality Date  . ELBOW SURGERY    . KNEE SURGERY    . NECK SURGERY    . SPINE SURGERY      SOCIAL HISTORY:  Social History   Tobacco Use  . Smoking status: Former Games developer  . Smokeless tobacco: Never Used  Substance Use Topics  . Alcohol use: Yes    Comment: 8-10 40oz/beer daily    FAMILY HISTORY:  Family History  Problem Relation Age of Onset  . Stroke Paternal Grandfather     DRUG  ALLERGIES: No Known Allergies  REVIEW OF SYSTEMS:   CONSTITUTIONAL: No fever, fatigue or weakness.  EYES: No blurred or double vision.  EARS, NOSE, AND THROAT: No tinnitus or ear pain.  RESPIRATORY: No cough, shortness of breath, wheezing or hemoptysis.  CARDIOVASCULAR: No chest pain, orthopnea, edema.  GASTROINTESTINAL: No nausea, blood in vomiting, no diarrhea or abdominal pain.  GENITOURINARY: No dysuria, hematuria.  ENDOCRINE: No polyuria, nocturia,  HEMATOLOGY: No anemia, easy bruising or bleeding SKIN: No rash or lesion. MUSCULOSKELETAL: No joint pain or arthritis.   NEUROLOGIC: No tingling, numbness, weakness.  PSYCHIATRY: No anxiety or depression.   MEDICATIONS AT HOME:  Prior to Admission medications   Medication Sig Start Date End Date Taking? Authorizing Provider  ibuprofen (ADVIL,MOTRIN) 600 MG tablet Take 1 tablet (600 mg total) by mouth every 8 (eight) hours as needed. 08/08/16  Yes Tommi Rumps, PA-C  tamsulosin (FLOMAX) 0.4 MG CAPS capsule Take 1 capsule (0.4 mg total) by mouth daily. 08/03/17  Yes Lada, Janit Bern, MD  ranitidine (ZANTAC) 150 MG tablet Take 1 tablet (150 mg total) by mouth 2 (two) times daily. Patient not taking: Reported on 11/07/2017 08/03/17   Kerman Passey, MD      PHYSICAL EXAMINATION:   VITAL SIGNS: Blood pressure 123/80, pulse 77, resp. rate  18, height 5\' 11"  (1.803 m), weight 86.2 kg, SpO2 99 %.  GENERAL:  57 y.o.-year-old patient lying in the bed with no acute distress.  EYES: Pupils equal, round, reactive to light and accommodation. No scleral icterus. Extraocular muscles intact.  HEENT: Head atraumatic, normocephalic. Oropharynx and nasopharynx clear.  NECK:  Supple, no jugular venous distention. No thyroid enlargement, no tenderness.  LUNGS: Normal breath sounds bilaterally, no wheezing, rales,rhonchi or crepitation. No use of accessory muscles of respiration.  CARDIOVASCULAR: S1, S2 normal. No murmurs, rubs, or gallops.  ABDOMEN:  Soft, nontender, nondistended. Bowel sounds present. No organomegaly or mass.  EXTREMITIES: No pedal edema, cyanosis, or clubbing.  NEUROLOGIC: Cranial nerves II through XII are intact. Muscle strength 5/5 in left side extremities 3-4/5 in the right side extremities. Sensation intact. Gait not checked.  PSYCHIATRIC: The patient is alert and oriented x 3.  SKIN: No obvious rash, lesion, or ulcer.   LABORATORY PANEL:   CBC Recent Labs  Lab 11/07/17 1253  WBC 4.3  HGB 14.2  HCT 41.4  PLT 95*  MCV 96.7  MCH 33.2  MCHC 34.3  RDW 13.0   ------------------------------------------------------------------------------------------------------------------  Chemistries  Recent Labs  Lab 11/07/17 1253  NA 140  K 3.8  CL 103  CO2 20*  GLUCOSE 121*  BUN 7  CREATININE 0.77  CALCIUM 9.3  AST 478*  ALT 169*  ALKPHOS 82  BILITOT 1.3*   ------------------------------------------------------------------------------------------------------------------ estimated creatinine clearance is 111.1 mL/min (by C-G formula based on SCr of 0.77 mg/dL). ------------------------------------------------------------------------------------------------------------------ No results for input(s): TSH, T4TOTAL, T3FREE, THYROIDAB in the last 72 hours.  Invalid input(s): FREET3   Coagulation profile No results for input(s): INR, PROTIME in the last 168 hours. ------------------------------------------------------------------------------------------------------------------- No results for input(s): DDIMER in the last 72 hours. -------------------------------------------------------------------------------------------------------------------  Cardiac Enzymes No results for input(s): CKMB, TROPONINI, MYOGLOBIN in the last 168 hours.  Invalid input(s): CK ------------------------------------------------------------------------------------------------------------------ Invalid input(s):  POCBNP  ---------------------------------------------------------------------------------------------------------------  Urinalysis    Component Value Date/Time   COLORURINE Straw 06/04/2013 2142   APPEARANCEUR Clear 06/04/2013 2142   LABSPEC 1.004 06/04/2013 2142   PHURINE 5.0 06/04/2013 2142   GLUCOSEU Negative 06/04/2013 2142   HGBUR Negative 06/04/2013 2142   BILIRUBINUR Negative 06/04/2013 2142   KETONESUR Negative 06/04/2013 2142   PROTEINUR Negative 06/04/2013 2142   NITRITE Negative 06/04/2013 2142   LEUKOCYTESUR Negative 06/04/2013 2142     RADIOLOGY: No results found.  EKG: Orders placed or performed during the hospital encounter of 11/07/17  . EKG 12-Lead  . EKG 12-Lead    IMPRESSION AND PLAN:  *Hematemesis This could be either his swallowed blood from bleeding from tooth extraction site Or could be gastric ulcer secondary to excessive use of NSAIDs. Hemoglobin is currently stable, continue to monitor. Protonix drip is started by ER, will keep on liquid diet and keep n.p.o. after midnight. GI consult.  *Alcohol dependence Patient has mild withdrawal symptoms. We will give Ativan as needed.  *History of stroke with right-sided hemiparesis Not on aspirin, due to low platelet I would like to avoid that anyways. Monitor.  *Thrombocytopenia Likely secondary to chronic alcoholism.  *Hepatitis C Chronic and stable as per patient.  All the records are reviewed and case discussed with ED provider. Management plans discussed with the patient, family and they are in agreement.  CODE STATUS: Full.    TOTAL TIME TAKING CARE OF THIS PATIENT: 45 minutes.    Altamese Dilling M.D on 11/07/2017   Between 7am to 6pm - Pager -  [phone]  After 6pm go to [email or web address] - password [email or web address]  [facility]  Office  [phone]  CC: Primary care physician; Larene Pickett, FNP   Note: This dictation was prepared with Dragon dictation  along with smaller phrase technology. Any transcriptional errors that result from this process are unintentional.

## 2017-11-07 NOTE — ED Provider Notes (Signed)
[facility] Emergency Department Provider Note  Time seen: 1:05 PM  I have reviewed the triage vital signs and the nursing notes.   HISTORY  Chief Complaint Emesis    HPI Steven Gardner is a 57 y.o. male with a past medical history of hepatitis C, diabetes, chronic pain, alcohol abuse, presents to the emergency department for nausea, vomiting, bloody vomitus, shaking and diaphoresis.  According to the patient he had a tooth pulled 1 month ago, this morning while eating breakfast he states the area began bleeding once again.  States he was swallowing blood.  Vomited twice, states the first time he vomited it appeared like normal vomitus over the second time of the.  Very red like blood.  Upon arrival to the emergency department patient was shaky and quite diaphoretic.  Admits to 6-8 beers per day but is not drinking any beer or alcohol today.  Upon my examination patient is quite diaphoretic, tremulous.  Complaining of mild abdominal discomfort.   Past Medical History:  Diagnosis Date  . Hepatitis C   . Paresthesias   . Spinal stenosis   . Stroke [facility])     Patient Active Problem List   Diagnosis Date Noted  . Benign prostatic hyperplasia with urinary frequency 08/12/2017  . Type 2 diabetes mellitus (HCC) 08/03/2017  . Hx of hepatitis C 08/03/2017  . Chronic lower back pain 08/03/2017  . Cervical radiculopathy at C6 08/03/2017    Past Surgical History:  Procedure Laterality Date  . ELBOW SURGERY    . KNEE SURGERY    . NECK SURGERY    . SPINE SURGERY      Prior to Admission medications   Medication Sig Start Date End Date Taking? Authorizing Provider  ibuprofen (ADVIL,MOTRIN) 600 MG tablet Take 1 tablet (600 mg total) by mouth every 8 (eight) hours as needed. 08/08/16   Tommi Rumps, PA-C  ranitidine (ZANTAC) 150 MG tablet Take 1 tablet (150 mg total) by mouth 2 (two) times daily. 08/03/17   Kerman Passey, MD  tamsulosin (FLOMAX) 0.4 MG CAPS  capsule Take 1 capsule (0.4 mg total) by mouth daily. 08/03/17   Kerman Passey, MD    No Known Allergies  History reviewed. No pertinent family history.  Social History Social History   Tobacco Use  . Smoking status: Former Games developer  . Smokeless tobacco: Never Used  Substance Use Topics  . Alcohol use: Yes    Comment: 8-10 40oz/beer daily  . Drug use: No    Review of Systems Constitutional: Negative for fever. Cardiovascular: Negative for chest pain. Respiratory: Negative for shortness of breath. Gastrointestinal: Mild abdominal discomfort/tightness today.  Positive for nausea vomiting x2 Genitourinary: Negative for urinary compaints Musculoskeletal: Negative for musculoskeletal complaints Skin: Negative for skin complaints  Neurological: Negative for headache All other ROS negative  ____________________________________________   PHYSICAL EXAM:  VITAL SIGNS: ED Triage Vitals  Enc Vitals Group     BP 11/07/17 1246 123/80     Pulse Rate 11/07/17 1246 77     Resp 11/07/17 1246 18     Temp --      Temp src --      SpO2 11/07/17 1246 99 %     Weight 11/07/17 1245 190 lb (86.2 kg)     Height 11/07/17 1245 5\' 11"  (1.803 m)     Head Circumference --      Peak Flow --      Pain Score 11/07/17 1245 6  Pain Loc --      Pain Edu? --      Excl. in GC? --     Constitutional: Alert, oriented, moderate tremors, moderate diaphoresis Eyes: Normal exam ENT   Head: Normocephalic and atraumatic.   Mouth/Throat: Mucous membranes are moist. Cardiovascular: Normal rate, regular rhythm. No murmur Respiratory: Normal respiratory effort without tachypnea nor retractions. Breath sounds are clear Gastrointestinal: Soft, largely nontender, no distention. Musculoskeletal: Nontender with normal range of motion in all extremities.  Neurologic:  Normal speech and language. No gross focal neurologic deficits Skin:  Skin is warm, moderate diaphoresis. Psychiatric: Mood and affect  are normal.   ____________________________________________    EKG  EKG reviewed and interpreted by myself shows normal sinus rhythm at 61 bpm with a narrow QRS, normal axis, normal intervals, no concerning ST changes.  Normal EKG  ____________________________________________   INITIAL IMPRESSION / ASSESSMENT AND PLAN / ED COURSE  Pertinent labs & imaging results that were available during my care of the patient were reviewed by me and considered in my medical decision making (see chart for details).  Patient presents to the emergency department for vomiting, is tremulous, diaphoretic.  Patient states he did vomit blood x1 this morning.  However he states this was shortly after his left upper molar began bleeding again status post tooth extraction 1 month ago.  We will check labs including coags.  We will treat with IV fluids.  Given the tremor and diaphoresis we will dose 2 mg of Ativan, IV hydrate and continue to closely monitor.  Labs are resulted showing elevated lipase 156 consistent with a mild to moderate pancreatitis.  Continues to the upper abdominal pain.  Patient did spit up once again with blood in the spit up.  Given the bloody vomit now with bloody spit up we will admit to the hospitalist service for monitoring overnight.  Blood could very likely be related to recent tooth extraction but could possibly be related to upper GI bleed as the patient is an alcoholic.  Platelet count of 95.  We will start the patient on Protonix and admit to the hospitalist service for continued work-up and monitoring.  Patient agreeable to plan of care.  ____________________________________________   FINAL CLINICAL IMPRESSION(S) / ED DIAGNOSES  Nausea vomiting Withdrawal Bloody vomit Pancreatitis   Minna Antis, MD 11/07/17 1427

## 2017-11-07 NOTE — ED Triage Notes (Signed)
Pt turned pale and diaphoretic out of no where in triage. Drank 8 beers yesterday, but none since. Pt is daily drinker.

## 2017-11-08 ENCOUNTER — Observation Stay: Payer: Medicaid Other | Admitting: Anesthesiology

## 2017-11-08 ENCOUNTER — Encounter: Payer: Self-pay | Admitting: Anesthesiology

## 2017-11-08 ENCOUNTER — Encounter
Admission: EM | Disposition: A | Discharge status: Home / Self Care | Payer: Self-pay | Source: Home / Self Care | Attending: Emergency Medicine

## 2017-11-08 DIAGNOSIS — K2900 Acute gastritis without bleeding: Secondary | ICD-10-CM

## 2017-11-08 DIAGNOSIS — K920 Hematemesis: Secondary | ICD-10-CM

## 2017-11-08 HISTORY — PX: ESOPHAGOGASTRODUODENOSCOPY (EGD) WITH PROPOFOL: SHX5813

## 2017-11-08 LAB — BASIC METABOLIC PANEL
Anion gap: 8 (ref 5–15)
BUN: 8 mg/dL (ref 6–20)
CO2: 27 mmol/L (ref 22–32)
CREATININE: 0.83 mg/dL (ref 0.61–1.24)
Calcium: 9 mg/dL (ref 8.9–10.3)
Chloride: 103 mmol/L (ref 98–111)
GFR calc Af Amer: >60 mL/min (ref >60)
Glucose, Bld: 105 mg/dL — ABNORMAL HIGH (ref 70–99)
Potassium: 3.8 mmol/L (ref 3.5–5.1)
SODIUM: 138 mmol/L (ref 135–145)

## 2017-11-08 LAB — CBC
HCT: 38.8 % — ABNORMAL LOW (ref 39.0–52.0)
Hemoglobin: 12.9 g/dL — ABNORMAL LOW (ref 13.0–17.0)
MCH: 32.6 pg (ref 26.0–34.0)
MCHC: 33.2 g/dL (ref 30.0–36.0)
MCV: 98 fL (ref 80.0–100.0)
PLATELETS: 68 10*3/uL — AB (ref 150–400)
RBC: 3.96 MIL/uL — ABNORMAL LOW (ref 4.22–5.81)
RDW: 12.7 % (ref 11.5–15.5)
WBC: 4.2 10*3/uL (ref 4.0–10.5)
nRBC: 0 % (ref 0.0–0.2)

## 2017-11-08 SURGERY — ESOPHAGOGASTRODUODENOSCOPY (EGD) WITH PROPOFOL
Anesthesia: General

## 2017-11-08 MED ORDER — SODIUM CHLORIDE 0.9 % IV SOLN
INTRAVENOUS | Status: DC | PRN
  Administered 2017-11-08: 16:00:00 via INTRAVENOUS

## 2017-11-08 MED ORDER — PROPOFOL 10 MG/ML IV BOLUS
INTRAVENOUS | Status: DC | PRN
  Administered 2017-11-08: 90 mg via INTRAVENOUS

## 2017-11-08 MED ORDER — PROMETHAZINE HCL 25 MG/ML IJ SOLN
12.5000 mg | Freq: Four times a day (QID) | INTRAMUSCULAR | Status: DC | PRN
  Administered 2017-11-08: 12.5 mg via INTRAVENOUS
  Filled 2017-11-08: qty 1

## 2017-11-08 MED ORDER — MIDAZOLAM HCL 2 MG/2ML IJ SOLN
INTRAMUSCULAR | Status: DC | PRN
  Administered 2017-11-08 (×2): 1 mg via INTRAVENOUS

## 2017-11-08 MED ORDER — FENTANYL CITRATE (PF) 100 MCG/2ML IJ SOLN
INTRAMUSCULAR | Status: AC
  Filled 2017-11-08: qty 2

## 2017-11-08 MED ORDER — ADULT MULTIVITAMIN W/MINERALS CH: 1.0000 | ORAL_TABLET | Freq: Every day | ORAL | 0 refills | Status: DC

## 2017-11-08 MED ORDER — HYDROMORPHONE HCL 1 MG/ML IJ SOLN
0.5000 mg | INTRAMUSCULAR | Status: DC | PRN
  Administered 2017-11-08 (×2): 0.5 mg via INTRAVENOUS
  Filled 2017-11-08 (×2): qty 0.5

## 2017-11-08 MED ORDER — FENTANYL CITRATE (PF) 100 MCG/2ML IJ SOLN
INTRAMUSCULAR | Status: DC | PRN
  Administered 2017-11-08: 50 ug via INTRAVENOUS

## 2017-11-08 MED ORDER — PROPOFOL 10 MG/ML IV BOLUS
INTRAVENOUS | Status: AC
  Filled 2017-11-08: qty 20

## 2017-11-08 MED ORDER — HYDROCODONE-ACETAMINOPHEN 5-325 MG PO TABS
1.0000 | ORAL_TABLET | Freq: Three times a day (TID) | ORAL | Status: DC | PRN
  Administered 2017-11-08: 1 via ORAL
  Filled 2017-11-08 (×4): qty 1

## 2017-11-08 MED ORDER — HYDROCODONE-ACETAMINOPHEN 5-325 MG PO TABS
2.0000 | ORAL_TABLET | Freq: Three times a day (TID) | ORAL | Status: DC | PRN
  Administered 2017-11-09: 2 via ORAL
  Filled 2017-11-08: qty 2

## 2017-11-08 MED ORDER — PANTOPRAZOLE SODIUM 40 MG PO TBEC: 40.0000 mg | DELAYED_RELEASE_TABLET | Freq: Every day | ORAL | 1 refills | Status: DC

## 2017-11-08 MED ORDER — MIDAZOLAM HCL 2 MG/2ML IJ SOLN
INTRAMUSCULAR | Status: AC
  Filled 2017-11-08: qty 2

## 2017-11-08 NOTE — Anesthesia Postprocedure Evaluation (Signed)
Anesthesia Post Note  Patient: [name]  Procedure(s) Performed: ESOPHAGOGASTRODUODENOSCOPY (EGD) WITH PROPOFOL (N/A )  Patient location during evaluation: Endoscopy Anesthesia Type: General Level of consciousness: awake and alert Pain management: pain level controlled Vital Signs Assessment: post-procedure vital signs reviewed and stable Respiratory status: spontaneous breathing, nonlabored ventilation, respiratory function stable and patient connected to nasal cannula oxygen Cardiovascular status: blood pressure returned to baseline and stable Postop Assessment: no apparent nausea or vomiting Anesthetic complications: no     Last Vitals:  Vitals:   11/08/17 1639 11/08/17 1642  BP: 117/89 117/89  Pulse: 64   Resp: 18 16  Temp: (!) 36.1 C   SpO2:  97%    Last Pain:  Vitals:   11/08/17 1659  TempSrc:   PainSc: 0-No pain                 [name]

## 2017-11-08 NOTE — Op Note (Signed)
[facility] Gastroenterology Patient Name: Steven Gardner Procedure Date: 11/08/2017 4:18 PM MRN: [id number] Account #: [id number] Date of Birth: 1960-09-20 Admit Type: Inpatient Age: 57 Room: [facility] ENDO ROOM 4 Gender: Male Note Status: Finalized Procedure:            Upper GI endoscopy Indications:          Hematemesis Providers:            Midge Minium MD, MD Referring MD:         Darrin Nipper. Kizzie Ide (Referring MD) Medicines:            Propofol per Anesthesia Complications:        No immediate complications. Procedure:            Pre-Anesthesia Assessment:                       - Prior to the procedure, a History and Physical was                        performed, and patient medications and allergies were                        reviewed. The patient's tolerance of previous                        anesthesia was also reviewed. The risks and benefits of                        the procedure and the sedation options and risks were                        discussed with the patient. All questions were                        answered, and informed consent was obtained. Prior                        Anticoagulants: The patient has taken no previous                        anticoagulant or antiplatelet agents. ASA Grade                        Assessment: II - A patient with mild systemic disease.                        After reviewing the risks and benefits, the patient was                        deemed in satisfactory condition to undergo the                        procedure.                       After obtaining informed consent, the endoscope was                        passed under direct vision. Throughout the procedure,  the patient's blood pressure, pulse, and oxygen                        saturations were monitored continuously. The Endoscope                        was introduced through the mouth, and advanced to the   second part of duodenum. The upper GI endoscopy was                        accomplished without difficulty. The patient tolerated                        the procedure well. Findings:      The examined esophagus was normal.      Localized moderate inflammation characterized by erythema was found in       the gastric antrum.      The examined duodenum was normal. Impression:           - Normal esophagus.                       - Gastritis.                       - Normal examined duodenum.                       - No specimens collected. Recommendation:       - Return patient to hospital ward for ongoing care.                       - Resume previous diet.                       - Continue present medications. Procedure Code(s):    --- Professional ---                       [phone], Esophagogastroduodenoscopy, flexible, transoral;                        diagnostic, including collection of specimen(s) by                        brushing or washing, when performed (separate procedure) Diagnosis Code(s):    --- Professional ---                       K92.0, Hematemesis                       K29.70, Gastritis, unspecified, without bleeding CPT copyright 2018 American Medical Association. All rights reserved. The codes documented in this report are preliminary and upon coder review may  be revised to meet current compliance requirements. Midge Minium MD, MD 11/08/2017 4:37:04 PM This report has been signed electronically. Number of Addenda: 0 Note Initiated On: 11/08/2017 4:18 PM      [facility]

## 2017-11-08 NOTE — Anesthesia Preprocedure Evaluation (Signed)
Anesthesia Evaluation  Patient identified by MRN, date of birth, ID band Patient awake    Reviewed: Allergy & Precautions, NPO status , Patient's Chart, lab work & pertinent test results, reviewed documented beta blocker date and time   Airway Mallampati: II  TM Distance: >3 FB     Dental  (+) Chipped   Pulmonary former smoker,           Cardiovascular      Neuro/Psych  Neuromuscular disease CVA, Residual Symptoms    GI/Hepatic (+) Hepatitis -, C  Endo/Other  diabetes, Type 2  Renal/GU      Musculoskeletal   Abdominal   Peds  Hematology   Anesthesia Other Findings Hx ETOH. Tooth pulled with inc bleeding. He thinks the hematemesis is from that.  Reproductive/Obstetrics                             Anesthesia Physical Anesthesia Plan  ASA: III  Anesthesia Plan: General   Post-op Pain Management:    Induction: Intravenous  PONV Risk Score and Plan:   Airway Management Planned:   Additional Equipment:   Intra-op Plan:   Post-operative Plan:   Informed Consent: I have reviewed the patients History and Physical, chart, labs and discussed the procedure including the risks, benefits and alternatives for the proposed anesthesia with the patient or authorized representative who has indicated his/her understanding and acceptance.     Plan Discussed with: CRNA  Anesthesia Plan Comments:         Anesthesia Quick Evaluation

## 2017-11-08 NOTE — Progress Notes (Signed)
Clarification of PRN Dilaudid order from PO to IV.

## 2017-11-08 NOTE — Progress Notes (Signed)
Pt is still very anxious, needs ativan per CIWA protocol. Family is not comfortable taking him home today. Will cancel discharge and continue treatment for now.

## 2017-11-08 NOTE — Anesthesia Post-op Follow-up Note (Signed)
Anesthesia QCDR form completed.        

## 2017-11-08 NOTE — Progress Notes (Signed)
[facility] - [place] at [facility]   PATIENT NAME: Steven Gardner    MR#:  [id number]  DATE OF BIRTH:  07-04-1960  SUBJECTIVE:  CHIEF COMPLAINT:   Chief Complaint  Patient presents with  . Emesis   Patient without complaint, earlier patient did have some nausea/emesis-better with Phenergan, await gastroenterology evaluation REVIEW OF SYSTEMS:  CONSTITUTIONAL: No fever, fatigue or weakness.  EYES: No blurred or double vision.  EARS, NOSE, AND THROAT: No tinnitus or ear pain.  RESPIRATORY: No cough, shortness of breath, wheezing or hemoptysis.  CARDIOVASCULAR: No chest pain, orthopnea, edema.  GASTROINTESTINAL: No nausea, vomiting, diarrhea or abdominal pain.  GENITOURINARY: No dysuria, hematuria.  ENDOCRINE: No polyuria, nocturia,  HEMATOLOGY: No anemia, easy bruising or bleeding SKIN: No rash or lesion. MUSCULOSKELETAL: No joint pain or arthritis.   NEUROLOGIC: No tingling, numbness, weakness.  PSYCHIATRY: No anxiety or depression.   ROS  DRUG ALLERGIES:  No Known Allergies  VITALS:  Blood pressure (!) 148/82, pulse 66, temperature 98.3 F (36.8 C), temperature source Oral, resp. rate 20, height 5\' 11"  (1.803 m), weight 86.2 kg, SpO2 95 %.  PHYSICAL EXAMINATION:  GENERAL:  57 y.o.-year-old patient lying in the bed with no acute distress.  EYES: Pupils equal, round, reactive to light and accommodation. No scleral icterus. Extraocular muscles intact.  HEENT: Head atraumatic, normocephalic. Oropharynx and nasopharynx clear.  NECK:  Supple, no jugular venous distention. No thyroid enlargement, no tenderness.  LUNGS: Normal breath sounds bilaterally, no wheezing, rales,rhonchi or crepitation. No use of accessory muscles of respiration.  CARDIOVASCULAR: S1, S2 normal. No murmurs, rubs, or gallops.  ABDOMEN: Soft, nontender, nondistended. Bowel sounds present. No organomegaly or mass.  EXTREMITIES: No pedal edema, cyanosis, or clubbing.  NEUROLOGIC:  Cranial nerves II through XII are intact. Muscle strength 5/5 in all extremities. Sensation intact. Gait not checked.  PSYCHIATRIC: The patient is alert and oriented x 3.  SKIN: No obvious rash, lesion, or ulcer.   Physical Exam LABORATORY PANEL:   CBC Recent Labs  Lab 11/08/17 0353  WBC 4.2  HGB 12.9*  HCT 38.8*  PLT 68*   ------------------------------------------------------------------------------------------------------------------  Chemistries  Recent Labs  Lab 11/07/17 1253 11/08/17 0353  NA 140 138  K 3.8 3.8  CL 103 103  CO2 20* 27  GLUCOSE 121* 105*  BUN 7 8  CREATININE 0.77 0.83  CALCIUM 9.3 9.0  AST 478*  --   ALT 169*  --   ALKPHOS 82  --   BILITOT 1.3*  --    ------------------------------------------------------------------------------------------------------------------  Cardiac Enzymes No results for input(s): TROPONINI in the last 168 hours. ------------------------------------------------------------------------------------------------------------------  RADIOLOGY:  No results found.  ASSESSMENT AND PLAN:  *Acute Hematemesis Stable This could be either his swallowed blood from bleeding from tooth extraction site Or could be gastric ulcer secondary to excessive use of NSAIDs. Continue Protonix, n.p.o. except for meds, await gastroenterology evaluation/recommendations   *Chronic alcohol dependence Stable Continue alcohol withdrawal protocol  *History of stroke with right-sided hemiparesis Not on aspirin due to chronic alcohol induced thrombocytopenia  *Thrombocytopenia, chronic Stable  secondary to chronic alcoholism.  *Hepatitis C Stable  Gastroenterology to see   Disposition pending clinical course/gastroenterology input   All the records are reviewed and case discussed with Care Management/Social Workerr. Management plans discussed with the patient, family and they are in agreement.  CODE STATUS: full  TOTAL TIME TAKING  CARE OF THIS PATIENT: 40 minutes.     POSSIBLE D/C IN 1-2 DAYS, DEPENDING ON CLINICAL  CONDITION.   Evelena Asa Salary M.D on 11/08/2017   Between 7am to 6pm - Pager - [phone]  After 6pm go to [email or web address] - password [email or web address]  [facility] [place] [facility]  Office  [phone]  CC: Primary care physician; Larene Pickett, FNP  Note: This dictation was prepared with Dragon dictation along with smaller phrase technology. Any transcriptional errors that result from this process are unintentional.

## 2017-11-08 NOTE — Transfer of Care (Signed)
Immediate Anesthesia Transfer of Care Note  Patient: Steven Gardner  Procedure(s) Performed: ESOPHAGOGASTRODUODENOSCOPY (EGD) WITH PROPOFOL (N/A )  Patient Location: Endoscopy Unit  Anesthesia Type:General  Level of Consciousness: sedated and responds to stimulation  Airway & Oxygen Therapy: Patient Spontanous Breathing and Patient connected to nasal cannula oxygen  Post-op Assessment: Report given to RN and Post -op Vital signs reviewed and stable  Post vital signs: Reviewed and stable  Last Vitals:  Vitals Value Taken Time  BP 117/89 11/08/2017  4:42 PM  Temp 36.1 C 11/08/2017  4:39 PM  Pulse 67 11/08/2017  4:42 PM  Resp 17 11/08/2017  4:42 PM  SpO2 96 % 11/08/2017  4:42 PM  Vitals shown include unvalidated device data.  Last Pain:  Vitals:   11/08/17 1639  TempSrc: Tympanic  PainSc: Asleep         Complications: No apparent anesthesia complications

## 2017-11-08 NOTE — Progress Notes (Signed)
Patient anxious, diaphoretic, and agitated upon returning from Endoscopy. Stated he was ready to go. Paged Dr Katheren Shams, Servando Snare and Prime [facility]. Dr Servando Snare signing off. Dr Katheren Shams put in discharge orders and a prescription. Patient has not eaten but is now on a regular diet. Patient stated he will eat and then leave if he does not vomit. AVS printed

## 2017-11-09 LAB — HIV ANTIBODY (ROUTINE TESTING W REFLEX): HIV Screen 4th Generation wRfx: NONREACTIVE

## 2017-11-09 MED ORDER — ONDANSETRON HCL 4 MG PO TABS
4.0000 mg | ORAL_TABLET | Freq: Four times a day (QID) | ORAL | Status: DC | PRN
  Administered 2017-11-09: 4 mg via ORAL
  Filled 2017-11-09: qty 1

## 2017-11-09 NOTE — Clinical Social Work Note (Addendum)
Clinical Social Work Assessment  Patient Details  Name: Steven Gardner MRN: [id number] Date of Birth: 04/14/1960  Date of referral:  11/09/17               Reason for consult:  Facility Placement                Permission sought to share information with:    Permission granted to share information::     Name::        Agency::     Relationship::     Contact Information:     Housing/Transportation Living arrangements for the past 2 months:  Apartment Source of Information:  Patient Patient Interpreter Needed:  None Criminal Activity/Legal Involvement Pertinent to Current Situation/Hospitalization:  No - Comment as needed Significant Relationships:  Significant Other Lives with:  Significant Other Do you feel safe going back to the place where you live?  Yes Need for family participation in patient care:  No (Coment)  Care giving concerns:  Patient resides with his significant other.   Social Worker assessment / plan:  CSW met with patient due to patient being discharged yesterday but his girlfriend not coming to pick him up last evening.  CSW met with patient and he states that his girlfriend was concerned about his health and meant well. He stated that they are not fighting and that she will be here after work today to pick him up. Patient discussed his drinking and that he drinks a six pack every night. He stated he went to a 28 day rehab last year and has cut down his drinking significantly. He denies having an issue with drinking and declines any resources for assistance with rehab.   Patient states he does not have a key to get into his home but states if his girlfriend doesn't come to pick him up after her work, he would like to be sent home by taxi.  Employment status:    Insurance information:    PT Recommendations:    Information / Referral to community resources:     Patient/Family's Response to care:  Patient expressed appreciation for CSW  visit.  Patient/Family's Understanding of and Emotional Response to Diagnosis, Current Treatment, and Prognosis:  Patient is frustrated because he did want to leave last evening but he is calm and cooperative.   Emotional Assessment Appearance:  Appears stated age Attitude/Demeanor/Rapport:  (pleasant and cooperative) Affect (typically observed):  Accepting, Calm Orientation:  Oriented to Self, Oriented to Place, Oriented to  Time, Oriented to Situation Alcohol / Substance use:  Alcohol Use Psych involvement (Current and /or in the community):  No (Comment)  Discharge Needs  Concerns to be addressed:  (potential transportation) Readmission within the last 30 days:  No Current discharge risk:  None Barriers to Discharge:  No Barriers Identified   Shela Leff, LCSW 11/09/2017, 10:27 AM

## 2017-11-09 NOTE — Progress Notes (Signed)
Spoke to patient's significant other Clara over the night to inform that patient has been discharged. She stated that she does not feel that it is safe for patient to be discharged due to signs of withdrawal and fear he is not ready to go home. Ativan given to patient over the night. Notified Dr. Caryn Bee.

## 2017-11-09 NOTE — Discharge Summary (Signed)
[facility] - [place] at [facility]   PATIENT NAME: Steven Gardner    MR#:  [id number]  DATE OF BIRTH:  [birth year]  DATE OF ADMISSION:  11/07/2017 ADMITTING PHYSICIAN: Altamese Dilling, MD  DATE OF DISCHARGE: No discharge date for patient encounter.  PRIMARY CARE PHYSICIAN: Larene Pickett, FNP    ADMISSION DIAGNOSIS:  Thrombocytopenia (HCC) [D69.6] Pain of upper abdomen [R10.10] Hematemesis with nausea [K92.0] Alcohol-induced acute pancreatitis, unspecified complication status [K85.20]  DISCHARGE DIAGNOSIS:  Principal Problem:   Hematemesis Active Problems:   Acute gastritis without hemorrhage   SECONDARY DIAGNOSIS:   Past Medical History:  Diagnosis Date  . Hepatitis C   . Paresthesias   . Spinal stenosis   . Stroke [facility])     HOSPITAL COURSE:  *Acute Hematemesis Resolved Status post EGD by gastroenterology-noted gastritis, patient treated with Protonix, tolerated diet, patient did well  *Chronic alcohol dependence Stable Treated on our alcohol withdrawal protocol  *History of stroke with right-sided hemiparesis Not on aspirin due to chronic alcohol induced thrombocytopenia  *Thrombocytopenia, chronic Stable  secondary to chronic alcoholism.  *Hepatitis C Stable  Gastroenterology did see patient while in house Patient follow-up with gastroenterology status post discharge for continued care/medical management  DISCHARGE CONDITIONS:  stable  CONSULTS OBTAINED:    DRUG ALLERGIES:  No Known Allergies  DISCHARGE MEDICATIONS:   Allergies as of 11/09/2017   No Known Allergies     Medication List    STOP taking these medications   ibuprofen 600 MG tablet Commonly known as:  ADVIL,MOTRIN     TAKE these medications   multivitamin with minerals Tabs tablet Take 1 tablet by mouth daily.   pantoprazole 40 MG tablet Commonly known as:  PROTONIX Take 1 tablet (40 mg total) by mouth daily.   ranitidine 150 MG  tablet Commonly known as:  ZANTAC Take 1 tablet (150 mg total) by mouth 2 (two) times daily.   tamsulosin 0.4 MG Caps capsule Commonly known as:  FLOMAX Take 1 capsule (0.4 mg total) by mouth daily.        DISCHARGE INSTRUCTIONS:  If you experience worsening of your admission symptoms, develop shortness of breath, life threatening emergency, suicidal or homicidal thoughts you must seek medical attention immediately by calling 911 or calling your MD immediately  if symptoms less severe.  You Must read complete instructions/literature along with all the possible adverse reactions/side effects for all the Medicines you take and that have been prescribed to you. Take any new Medicines after you have completely understood and accept all the possible adverse reactions/side effects.   Please note  You were cared for by a hospitalist during your hospital stay. If you have any questions about your discharge medications or the care you received while you were in the hospital after you are discharged, you can call the unit and asked to speak with the hospitalist on call if the hospitalist that took care of you is not available. Once you are discharged, your primary care physician will handle any further medical issues. Please note that NO REFILLS for any discharge medications will be authorized once you are discharged, as it is imperative that you return to your primary care physician (or establish a relationship with a primary care physician if you do not have one) for your aftercare needs so that they can reassess your need for medications and monitor your lab values.    Today   CHIEF COMPLAINT:   Chief Complaint  Patient presents with  .  Emesis    HISTORY OF PRESENT ILLNESS:  57 y.o. male with a known history of hepatitis C, paresthesia, spinal stenosis, stroke-has right-sided upper and lower extremity weakness since his stroke 4 years ago.  He was a heavy drinker before the stroke but since  stroke he cut down, now drinks 6-7 beers daily. He had tooth extraction last month from where he bled for 4 to 5 days.  He is having severe pain at the site and since then he is taking 4 ibuprofen tablets in a day for last 1 month.  Since yesterday after eating some hard food, he started bleeding at his tooth extraction site again which he is swallowing all the time.  Today he had some pain in his lower chest or upper abdomen area and he vomited blood.  Concerned with this he came to emergency room. He is noted to have low platelet and slightly higher lipase level. Hemoglobin is stable.  He had some shaking and agitation in ER had given 1 injection Ativan which helped and he is comfortable. VITAL SIGNS:  Blood pressure (!) 145/96, pulse 73, temperature 98.2 F (36.8 C), temperature source Oral, resp. rate 20, height 5\' 11"  (1.803 m), weight 86.2 kg, SpO2 96 %.  I/O:    Intake/Output Summary (Last 24 hours) at 11/09/2017 1025 Last data filed at 11/09/2017 0245 Gross per 24 hour  Intake 150 ml  Output 600 ml  Net -450 ml    PHYSICAL EXAMINATION:  GENERAL:  57 y.o.-year-old patient lying in the bed with no acute distress.  EYES: Pupils equal, round, reactive to light and accommodation. No scleral icterus. Extraocular muscles intact.  HEENT: Head atraumatic, normocephalic. Oropharynx and nasopharynx clear.  NECK:  Supple, no jugular venous distention. No thyroid enlargement, no tenderness.  LUNGS: Normal breath sounds bilaterally, no wheezing, rales,rhonchi or crepitation. No use of accessory muscles of respiration.  CARDIOVASCULAR: S1, S2 normal. No murmurs, rubs, or gallops.  ABDOMEN: Soft, non-tender, non-distended. Bowel sounds present. No organomegaly or mass.  EXTREMITIES: No pedal edema, cyanosis, or clubbing.  NEUROLOGIC: Cranial nerves II through XII are intact. Muscle strength 5/5 in all extremities. Sensation intact. Gait not checked.  PSYCHIATRIC: The patient is alert and  oriented x 3.  SKIN: No obvious rash, lesion, or ulcer.   DATA REVIEW:   CBC Recent Labs  Lab 11/08/17 0353  WBC 4.2  HGB 12.9*  HCT 38.8*  PLT 68*    Chemistries  Recent Labs  Lab 11/07/17 1253 11/08/17 0353  NA 140 138  K 3.8 3.8  CL 103 103  CO2 20* 27  GLUCOSE 121* 105*  BUN 7 8  CREATININE 0.77 0.83  CALCIUM 9.3 9.0  AST 478*  --   ALT 169*  --   ALKPHOS 82  --   BILITOT 1.3*  --     Cardiac Enzymes No results for input(s): TROPONINI in the last 168 hours.  Microbiology Results  No results found for this or any previous visit.  RADIOLOGY:  No results found.  EKG:   Orders placed or performed during the hospital encounter of 11/07/17  . EKG 12-Lead  . EKG 12-Lead      Management plans discussed with the patient, family and they are in agreement.  CODE STATUS:     Code Status Orders  (From admission, onward)         Start     Ordered   11/07/17 1623  Full code  Continuous  11/07/17 1622        Code Status History    This patient has a current code status but no historical code status.      TOTAL TIME TAKING CARE OF THIS PATIENT: 40 minutes.    Evelena Asa Aizlynn Digilio M.D on 11/09/2017 at 10:25 AM  Between 7am to 6pm - Pager - [phone]  After 6pm go to [email or web address] - password [email or web address]  [facility] [place] [facility]  Office  [phone]  CC: Primary care physician; Larene Pickett, FNP   Note: This dictation was prepared with Dragon dictation along with smaller phrase technology. Any transcriptional errors that result from this process are unintentional.

## 2017-11-09 NOTE — Care Management (Signed)
Patient to discharge today.  PCP Kizzie Ide at [facility].  New rx for protonix at discharge.   Coupon provided from [email or web address]

## 2017-11-09 NOTE — Progress Notes (Addendum)
Patient discharged per MD order. Patient discharge instructions, prescriptions and belongings given to patient. Patient had no questions or concerns at this time. Patient escorted via wheelchair to visitor entrance for departure. Patient being picked up by girlfriend.

## 2017-11-28 ENCOUNTER — Encounter: Payer: Self-pay | Admitting: Emergency Medicine

## 2017-11-28 ENCOUNTER — Emergency Department: Payer: Medicaid Other

## 2017-11-28 ENCOUNTER — Inpatient Hospital Stay
Admission: EM | Admit: 2017-11-28 | Discharge: 2017-11-29 | DRG: 439 | Discharge status: Against Medical Advice | Payer: Medicaid Other | Attending: Internal Medicine | Admitting: Internal Medicine

## 2017-11-28 DIAGNOSIS — Z823 Family history of stroke: Secondary | ICD-10-CM

## 2017-11-28 DIAGNOSIS — E119 Type 2 diabetes mellitus without complications: Secondary | ICD-10-CM | POA: Diagnosis present

## 2017-11-28 DIAGNOSIS — K852 Alcohol induced acute pancreatitis without necrosis or infection: Principal | ICD-10-CM | POA: Diagnosis present

## 2017-11-28 DIAGNOSIS — N401 Enlarged prostate with lower urinary tract symptoms: Secondary | ICD-10-CM | POA: Diagnosis present

## 2017-11-28 DIAGNOSIS — K567 Ileus, unspecified: Secondary | ICD-10-CM | POA: Diagnosis present

## 2017-11-28 DIAGNOSIS — Z87891 Personal history of nicotine dependence: Secondary | ICD-10-CM

## 2017-11-28 DIAGNOSIS — I700 Atherosclerosis of aorta: Secondary | ICD-10-CM | POA: Diagnosis present

## 2017-11-28 DIAGNOSIS — R350 Frequency of micturition: Secondary | ICD-10-CM | POA: Diagnosis present

## 2017-11-28 DIAGNOSIS — F10929 Alcohol use, unspecified with intoxication, unspecified: Secondary | ICD-10-CM

## 2017-11-28 DIAGNOSIS — F10129 Alcohol abuse with intoxication, unspecified: Secondary | ICD-10-CM | POA: Diagnosis present

## 2017-11-28 DIAGNOSIS — K760 Fatty (change of) liver, not elsewhere classified: Secondary | ICD-10-CM | POA: Diagnosis present

## 2017-11-28 DIAGNOSIS — Z8619 Personal history of other infectious and parasitic diseases: Secondary | ICD-10-CM

## 2017-11-28 DIAGNOSIS — Z8673 Personal history of transient ischemic attack (TIA), and cerebral infarction without residual deficits: Secondary | ICD-10-CM

## 2017-11-28 LAB — COMPREHENSIVE METABOLIC PANEL
ALK PHOS: 65 U/L (ref 38–126)
ALT: 92 U/L — AB (ref 0–44)
AST: 122 U/L — AB (ref 15–41)
Albumin: 4.8 g/dL (ref 3.5–5.0)
Anion gap: 16 — ABNORMAL HIGH (ref 5–15)
BUN: 9 mg/dL (ref 6–20)
CALCIUM: 8.7 mg/dL — AB (ref 8.9–10.3)
CO2: 25 mmol/L (ref 22–32)
CREATININE: 0.8 mg/dL (ref 0.61–1.24)
Chloride: 103 mmol/L (ref 98–111)
Glucose, Bld: 103 mg/dL — ABNORMAL HIGH (ref 70–99)
Potassium: 3.7 mmol/L (ref 3.5–5.1)
Sodium: 144 mmol/L (ref 135–145)
Total Bilirubin: 0.6 mg/dL (ref 0.3–1.2)
Total Protein: 8.2 g/dL — ABNORMAL HIGH (ref 6.5–8.1)

## 2017-11-28 LAB — CBC WITH DIFFERENTIAL/PLATELET
ABS IMMATURE GRANULOCYTES: 0.04 10*3/uL (ref 0.00–0.07)
Basophils Absolute: 0.1 10*3/uL (ref 0.0–0.1)
Basophils Relative: 1 %
Eosinophils Absolute: 0.1 10*3/uL (ref 0.0–0.5)
Eosinophils Relative: 1 %
HCT: 44.4 % (ref 39.0–52.0)
HEMOGLOBIN: 15.3 g/dL (ref 13.0–17.0)
Immature Granulocytes: 0 %
LYMPHS ABS: 3.1 10*3/uL (ref 0.7–4.0)
LYMPHS PCT: 24 %
MCH: 33.6 pg (ref 26.0–34.0)
MCHC: 34.5 g/dL (ref 30.0–36.0)
MCV: 97.6 fL (ref 80.0–100.0)
MONO ABS: 0.8 10*3/uL (ref 0.1–1.0)
MONOS PCT: 6 %
NEUTROS ABS: 8.6 10*3/uL — AB (ref 1.7–7.7)
Neutrophils Relative %: 68 %
Platelets: 197 10*3/uL (ref 150–400)
RBC: 4.55 MIL/uL (ref 4.22–5.81)
RDW: 12 % (ref 11.5–15.5)
WBC: 12.7 10*3/uL — ABNORMAL HIGH (ref 4.0–10.5)
nRBC: 0 % (ref 0.0–0.2)

## 2017-11-28 LAB — LIPASE, BLOOD: LIPASE: 101 U/L — AB (ref 11–51)

## 2017-11-28 LAB — TROPONIN I

## 2017-11-28 LAB — ETHANOL: Alcohol, Ethyl (B): 269 mg/dL — ABNORMAL HIGH (ref <10)

## 2017-11-28 MED ORDER — SODIUM CHLORIDE 0.9 % IV SOLN
Freq: Once | INTRAVENOUS | Status: AC
  Administered 2017-11-28: 21:00:00 via INTRAVENOUS

## 2017-11-28 MED ORDER — PANTOPRAZOLE SODIUM 40 MG IV SOLR
40.0000 mg | Freq: Once | INTRAVENOUS | Status: AC
  Administered 2017-11-28: 40 mg via INTRAVENOUS
  Filled 2017-11-28: qty 40

## 2017-11-28 MED ORDER — MORPHINE SULFATE (PF) 4 MG/ML IV SOLN
INTRAVENOUS | Status: AC
  Administered 2017-11-28: 4 mg via INTRAVENOUS
  Filled 2017-11-28: qty 1

## 2017-11-28 MED ORDER — ONDANSETRON HCL 4 MG/2ML IJ SOLN
4.0000 mg | Freq: Once | INTRAMUSCULAR | Status: AC
  Administered 2017-11-28: 4 mg via INTRAVENOUS

## 2017-11-28 MED ORDER — HYDROMORPHONE HCL 1 MG/ML IJ SOLN
1.0000 mg | Freq: Once | INTRAMUSCULAR | Status: AC
  Administered 2017-11-28: 1 mg via INTRAVENOUS
  Filled 2017-11-28: qty 1

## 2017-11-28 MED ORDER — IOHEXOL 300 MG/ML  SOLN
100.0000 mL | Freq: Once | INTRAMUSCULAR | Status: AC | PRN
  Administered 2017-11-28: 100 mL via INTRAVENOUS

## 2017-11-28 MED ORDER — MORPHINE SULFATE (PF) 4 MG/ML IV SOLN
4.0000 mg | Freq: Once | INTRAVENOUS | Status: AC
  Administered 2017-11-28: 4 mg via INTRAVENOUS

## 2017-11-28 MED ORDER — ONDANSETRON HCL 4 MG/2ML IJ SOLN
4.0000 mg | Freq: Once | INTRAMUSCULAR | Status: AC
  Administered 2017-11-28: 4 mg via INTRAVENOUS
  Filled 2017-11-28: qty 2

## 2017-11-28 MED ORDER — ONDANSETRON HCL 4 MG/2ML IJ SOLN
INTRAMUSCULAR | Status: AC
  Administered 2017-11-28: 4 mg via INTRAVENOUS
  Filled 2017-11-28: qty 2

## 2017-11-28 NOTE — ED Provider Notes (Signed)
[facility] Emergency Department Provider Note       Time seen: ----------------------------------------- 8:16 PM on 11/28/2017 -----------------------------------------   I have reviewed the triage vital signs and the nursing notes.  HISTORY   Chief Complaint No chief complaint on file.    HPI Steven Gardner is a 57 y.o. male with a history of hepatitis C, spinal stenosis, CVA, chronic alcohol abuse who presents to the ED for abdominal pain since drinking 5-6 beers this evening.  Patient has history of chronic daily alcohol use and pancreatitis.  Patient reports she was admitted recently for pancreatitis.  Patient states he typically cannot drink liquor but he can usually drink beer.  Past Medical History:  Diagnosis Date  . Hepatitis C   . Paresthesias   . Spinal stenosis   . Stroke Steven Gardner)     Patient Active Problem List   Diagnosis Date Noted  . Acute gastritis without hemorrhage   . Hematemesis 11/07/2017  . Benign prostatic hyperplasia with urinary frequency 08/12/2017  . Type 2 diabetes mellitus (HCC) 08/03/2017  . Hx of hepatitis C 08/03/2017  . Chronic lower back pain 08/03/2017  . Cervical radiculopathy at C6 08/03/2017    Past Surgical History:  Procedure Laterality Date  . ELBOW SURGERY    . ESOPHAGOGASTRODUODENOSCOPY (EGD) WITH PROPOFOL N/A 11/08/2017   Procedure: ESOPHAGOGASTRODUODENOSCOPY (EGD) WITH PROPOFOL;  Surgeon: Midge Minium, MD;  Location: [facility] ENDOSCOPY;  Service: Endoscopy;  Laterality: N/A;  . KNEE SURGERY    . NECK SURGERY    . SPINE SURGERY      Allergies Patient has no known allergies.  Social History Social History   Tobacco Use  . Smoking status: Former Games developer  . Smokeless tobacco: Never Used  Substance Use Topics  . Alcohol use: Yes    Comment: 8-10 40oz/beer daily  . Drug use: No   Review of Systems Constitutional: Negative for fever. Cardiovascular: Negative for chest pain. Respiratory:  Negative for shortness of breath. Gastrointestinal: Positive for abdominal pain, vomiting Musculoskeletal: Negative for back pain. Skin: Negative for rash. Neurological: Negative for headaches, focal weakness or numbness.  All systems negative/normal/unremarkable except as stated in the HPI  ____________________________________________   PHYSICAL EXAM:  VITAL SIGNS: ED Triage Vitals  Enc Vitals Group     BP      Pulse      Resp      Temp      Temp src      SpO2      Weight      Height      Head Circumference      Peak Flow      Pain Score      Pain Loc      Pain Edu?      Excl. in GC?    Constitutional: Alert and oriented.  Mild distress.  Patient smells heavily of alcohol Eyes: Conjunctivae are normal. Normal extraocular movements. ENT   Head: Normocephalic and atraumatic.   Nose: No congestion/rhinnorhea.   Mouth/Throat: Mucous membranes are moist.   Neck: No stridor. Cardiovascular: Normal rate, regular rhythm. No murmurs, rubs, or gallops. Respiratory: Normal respiratory effort without tachypnea nor retractions. Breath sounds are clear and equal bilaterally. No wheezes/rales/rhonchi. Gastrointestinal: Epigastric tenderness, no rebound or guarding.  Normal bowel sounds. Musculoskeletal: Nontender with normal range of motion in extremities. No lower extremity tenderness nor edema. Neurologic:  Normal speech and language. No gross focal neurologic deficits are appreciated.  Skin:  Skin is warm,  diaphoresis is noted Psychiatric: Mood and affect are normal. Speech and behavior are normal.  ____________________________________________  EKG: Interpreted by me.  Sinus rhythm rate of 73 bpm, normal PR interval, normal QRS, normal QT.  ____________________________________________  ED COURSE:  As part of my medical decision making, I reviewed the following data within the electronic MEDICAL RECORD NUMBER History obtained from family if available, nursing notes,  old chart and ekg, as well as notes from prior ED visits. Patient presented for abdominal pain, we will assess with labs and imaging as indicated at this time. Clinical Course as of Nov 29 2102  Wynelle Link Nov 28, 2017  2045 Alcohol, Ethyl (B)(!): 269 [JW]    Clinical Course User Index [JW] Steven Filbert, MD   Procedures ____________________________________________   LABS (pertinent positives/negatives)  Labs Reviewed  CBC WITH DIFFERENTIAL/PLATELET - Abnormal; Notable for the following components:      Result Value   WBC 12.7 (*)    Neutro Abs 8.6 (*)    All other components within normal limits  COMPREHENSIVE METABOLIC PANEL - Abnormal; Notable for the following components:   Glucose, Bld 103 (*)    Calcium 8.7 (*)    Total Protein 8.2 (*)    AST 122 (*)    ALT 92 (*)    Anion gap 16 (*)    All other components within normal limits  LIPASE, BLOOD - Abnormal; Notable for the following components:   Lipase 101 (*)    All other components within normal limits  ETHANOL - Abnormal; Notable for the following components:   Alcohol, Ethyl (B) 269 (*)    All other components within normal limits  TROPONIN I  URINALYSIS, COMPLETE (UACMP) WITH MICROSCOPIC  URINE DRUG SCREEN, QUALITATIVE ([facility] ONLY)    RADIOLOGY Images were viewed by me  Abdomen 2 view/CT abd/pelvis IMPRESSION: 1. Thick walled loops of distal ileum are nonspecific but most consistent with an enteritis which could be infectious, inflammatory, or ischemic. More proximal loops of small bowel are mildly dilated consistent with partial or early small bowel obstruction. 2. Hepatic steatosis. 3. Atherosclerosis in the abdominal aorta.  ____________________________________________  DIFFERENTIAL DIAGNOSIS   Gastritis, pancreatitis, chronic alcohol abuse, dehydration, electrolyte abnormality, occult infection  FINAL ASSESSMENT AND PLAN  Pancreatitis, alcohol intoxication, ileus   Plan: The patient had  presented for abdominal pain and vomiting. Patient's labs did indicate mild alcohol induced pancreatitis and likely gastritis. Patient's imaging resembles an ileus pattern.  Have discussed with general surgery to evaluate his CT but again I think it more is related to an ileus.  I will discuss with the hospitalist for admission.   Ulice Dash, MD   Note: This note was generated in part or whole with voice recognition software. Voice recognition is usually quite accurate but there are transcription errors that can and very often do occur. I apologize for any typographical errors that were not detected and corrected.     Steven Filbert, MD 11/28/17 2236

## 2017-11-28 NOTE — ED Triage Notes (Signed)
Pt arrived via EMS from home with generalized abdominal pain since finishing 5-6 beers this evening. Pt has hx/o daily ETOH use and pancreatitis. N/V associated.

## 2017-11-29 ENCOUNTER — Other Ambulatory Visit: Payer: Self-pay

## 2017-11-29 DIAGNOSIS — Z8619 Personal history of other infectious and parasitic diseases: Secondary | ICD-10-CM | POA: Diagnosis not present

## 2017-11-29 DIAGNOSIS — K567 Ileus, unspecified: Secondary | ICD-10-CM | POA: Diagnosis present

## 2017-11-29 DIAGNOSIS — Z823 Family history of stroke: Secondary | ICD-10-CM | POA: Diagnosis not present

## 2017-11-29 DIAGNOSIS — K852 Alcohol induced acute pancreatitis without necrosis or infection: Secondary | ICD-10-CM | POA: Diagnosis present

## 2017-11-29 DIAGNOSIS — Z87891 Personal history of nicotine dependence: Secondary | ICD-10-CM | POA: Diagnosis not present

## 2017-11-29 DIAGNOSIS — F10129 Alcohol abuse with intoxication, unspecified: Secondary | ICD-10-CM | POA: Diagnosis present

## 2017-11-29 DIAGNOSIS — E119 Type 2 diabetes mellitus without complications: Secondary | ICD-10-CM | POA: Diagnosis present

## 2017-11-29 DIAGNOSIS — Z8673 Personal history of transient ischemic attack (TIA), and cerebral infarction without residual deficits: Secondary | ICD-10-CM | POA: Diagnosis not present

## 2017-11-29 DIAGNOSIS — R350 Frequency of micturition: Secondary | ICD-10-CM | POA: Diagnosis present

## 2017-11-29 DIAGNOSIS — K760 Fatty (change of) liver, not elsewhere classified: Secondary | ICD-10-CM | POA: Diagnosis present

## 2017-11-29 DIAGNOSIS — N401 Enlarged prostate with lower urinary tract symptoms: Secondary | ICD-10-CM | POA: Diagnosis present

## 2017-11-29 DIAGNOSIS — I700 Atherosclerosis of aorta: Secondary | ICD-10-CM | POA: Diagnosis present

## 2017-11-29 LAB — CBC
HEMATOCRIT: 38.4 % — AB (ref 39.0–52.0)
Hemoglobin: 12.8 g/dL — ABNORMAL LOW (ref 13.0–17.0)
MCH: 33.2 pg (ref 26.0–34.0)
MCHC: 33.3 g/dL (ref 30.0–36.0)
MCV: 99.7 fL (ref 80.0–100.0)
NRBC: 0 % (ref 0.0–0.2)
Platelets: 152 10*3/uL (ref 150–400)
RBC: 3.85 MIL/uL — ABNORMAL LOW (ref 4.22–5.81)
RDW: 12.2 % (ref 11.5–15.5)
WBC: 7.5 10*3/uL (ref 4.0–10.5)

## 2017-11-29 LAB — URINALYSIS, COMPLETE (UACMP) WITH MICROSCOPIC
Bacteria, UA: NONE SEEN
Bilirubin Urine: NEGATIVE
Glucose, UA: NEGATIVE mg/dL
Hgb urine dipstick: NEGATIVE
KETONES UR: 20 mg/dL — AB
LEUKOCYTES UA: NEGATIVE
Nitrite: NEGATIVE
Protein, ur: NEGATIVE mg/dL
SPECIFIC GRAVITY, URINE: 1.04 — AB (ref 1.005–1.030)
SQUAMOUS EPITHELIAL / LPF: NONE SEEN (ref 0–5)
pH: 7 (ref 5.0–8.0)

## 2017-11-29 LAB — BASIC METABOLIC PANEL
ANION GAP: 13 (ref 5–15)
BUN: 9 mg/dL (ref 6–20)
CHLORIDE: 104 mmol/L (ref 98–111)
CO2: 26 mmol/L (ref 22–32)
Calcium: 8 mg/dL — ABNORMAL LOW (ref 8.9–10.3)
Creatinine, Ser: 0.85 mg/dL (ref 0.61–1.24)
GFR calc non Af Amer: >60 mL/min (ref >60)
Glucose, Bld: 82 mg/dL (ref 70–99)
Potassium: 4 mmol/L (ref 3.5–5.1)
Sodium: 143 mmol/L (ref 135–145)

## 2017-11-29 LAB — URINE DRUG SCREEN, QUALITATIVE (ARMC ONLY)
AMPHETAMINES, UR SCREEN: NOT DETECTED
BENZODIAZEPINE, UR SCRN: NOT DETECTED
Barbiturates, Ur Screen: NOT DETECTED
Cannabinoid 50 Ng, Ur ~~LOC~~: NOT DETECTED
Cocaine Metabolite,Ur ~~LOC~~: NOT DETECTED
MDMA (Ecstasy)Ur Screen: NOT DETECTED
METHADONE SCREEN, URINE: NOT DETECTED
OPIATE, UR SCREEN: POSITIVE — AB
Phencyclidine (PCP) Ur S: NOT DETECTED
TRICYCLIC, UR SCREEN: NOT DETECTED

## 2017-11-29 LAB — LIPASE, BLOOD: Lipase: 72 U/L — ABNORMAL HIGH (ref 11–51)

## 2017-11-29 MED ORDER — IBUPROFEN 400 MG PO TABS
400.0000 mg | ORAL_TABLET | Freq: Four times a day (QID) | ORAL | Status: DC | PRN
  Administered 2017-11-29: 400 mg via ORAL
  Filled 2017-11-29: qty 1

## 2017-11-29 MED ORDER — HYDROMORPHONE HCL 1 MG/ML IJ SOLN
INTRAMUSCULAR | Status: AC
  Filled 2017-11-29: qty 1

## 2017-11-29 MED ORDER — MORPHINE SULFATE (PF) 4 MG/ML IV SOLN
4.0000 mg | INTRAVENOUS | Status: DC | PRN
  Administered 2017-11-29 (×3): 4 mg via INTRAVENOUS
  Filled 2017-11-29 (×3): qty 1

## 2017-11-29 MED ORDER — PANTOPRAZOLE SODIUM 40 MG PO TBEC
40.0000 mg | DELAYED_RELEASE_TABLET | Freq: Every day | ORAL | Status: DC
  Administered 2017-11-29: 40 mg via ORAL
  Filled 2017-11-29: qty 1

## 2017-11-29 MED ORDER — ONDANSETRON HCL 4 MG PO TABS: 4.0000 mg | ORAL_TABLET | Freq: Four times a day (QID) | ORAL | Status: DC | PRN

## 2017-11-29 MED ORDER — LORAZEPAM 2 MG/ML IJ SOLN: 1.0000 mg | Freq: Four times a day (QID) | INTRAMUSCULAR | Status: DC | PRN

## 2017-11-29 MED ORDER — INFLUENZA VAC SPLIT QUAD 0.5 ML IM SUSY: 0.5000 mL | PREFILLED_SYRINGE | INTRAMUSCULAR | Status: DC

## 2017-11-29 MED ORDER — LORAZEPAM 1 MG PO TABS
1.0000 mg | ORAL_TABLET | Freq: Four times a day (QID) | ORAL | Status: DC | PRN
  Administered 2017-11-29: 1 mg via ORAL
  Filled 2017-11-29: qty 1

## 2017-11-29 MED ORDER — VITAMIN B-1 100 MG PO TABS
100.0000 mg | ORAL_TABLET | Freq: Every day | ORAL | Status: DC
  Administered 2017-11-29: 100 mg via ORAL
  Filled 2017-11-29: qty 1

## 2017-11-29 MED ORDER — LORAZEPAM 2 MG/ML IJ SOLN
0.0000 mg | Freq: Four times a day (QID) | INTRAMUSCULAR | Status: DC
  Administered 2017-11-29 (×4): 2 mg via INTRAVENOUS
  Filled 2017-11-29 (×4): qty 1

## 2017-11-29 MED ORDER — FOLIC ACID 1 MG PO TABS
1.0000 mg | ORAL_TABLET | Freq: Every day | ORAL | Status: DC
  Administered 2017-11-29: 1 mg via ORAL
  Filled 2017-11-29: qty 1

## 2017-11-29 MED ORDER — THIAMINE HCL 100 MG/ML IJ SOLN
100.0000 mg | Freq: Every day | INTRAMUSCULAR | Status: DC
  Filled 2017-11-29: qty 2

## 2017-11-29 MED ORDER — ONDANSETRON HCL 4 MG/2ML IJ SOLN
4.0000 mg | Freq: Four times a day (QID) | INTRAMUSCULAR | Status: DC | PRN
  Administered 2017-11-29: 4 mg via INTRAVENOUS
  Filled 2017-11-29: qty 2

## 2017-11-29 MED ORDER — PNEUMOCOCCAL VAC POLYVALENT 25 MCG/0.5ML IJ INJ: 0.5000 mL | INJECTION | INTRAMUSCULAR | Status: DC

## 2017-11-29 MED ORDER — TAMSULOSIN HCL 0.4 MG PO CAPS
0.4000 mg | ORAL_CAPSULE | Freq: Every day | ORAL | Status: DC
  Administered 2017-11-29: 0.4 mg via ORAL
  Filled 2017-11-29: qty 1

## 2017-11-29 MED ORDER — SODIUM CHLORIDE 0.9 % IV SOLN
INTRAVENOUS | Status: DC
  Administered 2017-11-29 (×3): via INTRAVENOUS

## 2017-11-29 MED ORDER — LORAZEPAM 2 MG/ML IJ SOLN: 0.0000 mg | Freq: Two times a day (BID) | INTRAMUSCULAR | Status: DC

## 2017-11-29 MED ORDER — OXYCODONE HCL 5 MG PO TABS
5.0000 mg | ORAL_TABLET | Freq: Four times a day (QID) | ORAL | Status: DC | PRN
  Administered 2017-11-29 (×2): 5 mg via ORAL
  Filled 2017-11-29 (×2): qty 1

## 2017-11-29 MED ORDER — HYDROMORPHONE HCL 1 MG/ML IJ SOLN
1.0000 mg | Freq: Once | INTRAMUSCULAR | Status: AC
  Administered 2017-11-29: 1 mg via INTRAVENOUS

## 2017-11-29 MED ORDER — ADULT MULTIVITAMIN W/MINERALS CH
1.0000 | ORAL_TABLET | Freq: Every day | ORAL | Status: DC
  Administered 2017-11-29: 1 via ORAL
  Filled 2017-11-29: qty 1

## 2017-11-29 MED ORDER — ENOXAPARIN SODIUM 40 MG/0.4ML ~~LOC~~ SOLN: 40.0000 mg | SUBCUTANEOUS | Status: DC

## 2017-11-29 MED ORDER — PROMETHAZINE HCL 25 MG/ML IJ SOLN: 25.0000 mg | Freq: Four times a day (QID) | INTRAMUSCULAR | Status: DC | PRN

## 2017-11-29 NOTE — Plan of Care (Signed)
CIWA score was 16 this morning. Dr. Allena Katz was notified verbally while int he floor. Nausea meds, ativan and pain medication provided. CIWA score was rechecked which was decreased to a score of 8 and hour after interventions. The patient has been stable. Pain is currently trying to be managed with PRN pain medication. Voiding. Npo sip with meds and the patient is able to drink water per diet order. Nausea is under control. CIWA scoring performed Q6.  Problem: Education: Goal: Knowledge of General Education information will improve Description Including pain rating scale, medication(s)/side effects and non-pharmacologic comfort measures Outcome: Progressing   Problem: Health Behavior/Discharge Planning: Goal: Ability to manage health-related needs will improve Outcome: Progressing   Problem: Clinical Measurements: Goal: Ability to maintain clinical measurements within normal limits will improve Outcome: Progressing Goal: Will remain free from infection Outcome: Progressing Goal: Diagnostic test results will improve Outcome: Progressing Goal: Respiratory complications will improve Outcome: Progressing Goal: Cardiovascular complication will be avoided Outcome: Progressing   Problem: Activity: Goal: Risk for activity intolerance will decrease Outcome: Progressing   Problem: Nutrition: Goal: Adequate nutrition will be maintained Outcome: Progressing   Problem: Coping: Goal: Level of anxiety will decrease Outcome: Progressing   Problem: Elimination: Goal: Will not experience complications related to bowel motility Outcome: Progressing Goal: Will not experience complications related to urinary retention Outcome: Progressing   Problem: Pain Managment: Goal: General experience of comfort will improve Outcome: Progressing   Problem: Safety: Goal: Ability to remain free from injury will improve Outcome: Progressing   Problem: Skin Integrity: Goal: Risk for impaired skin  integrity will decrease Outcome: Progressing   Problem: Spiritual Needs Goal: Ability to function at adequate level Outcome: Progressing

## 2017-11-29 NOTE — Care Management (Signed)
Patient admitted with alcohol induce acute pancreatitis.  Patient listed as self pay.  PCP Kizzie Ide at [facility].  Previous discharge patient was provided coupons from [email or web address].  RNCM for needs.

## 2017-11-29 NOTE — Clinical Social Work Note (Signed)
CSW consulted for ETOH abuse. Patient is a readmit and was seen by CSW on 10/15 for ETOH abuse. At that time, patient admitted to being an alcoholic but states he drinks much less now than he did. He states he has been through outpatient rehab in the past in which he completed a 28 day program. He stated that he does not feel as though he has a drinking problem and minimizes any issues as a result. Please re-consult CSW should patient wish to consider ETOH resources.  York Spaniel MSW,LCSW [phone]

## 2017-11-29 NOTE — Progress Notes (Signed)
Patient walking in hallway, has tremors and states he wants to go home. Patient refuses to go back to room. Patient is alert and oriented to person, place, time, and situation. Notified Dr. Nemiah Commander and she is aware that patient is leaving AMA. Patient has signed AMA form and is waiting for girlfriend Alan Ripper to take him home.

## 2017-11-29 NOTE — Consult Note (Addendum)
Subjective:   CC: SBO  HPI:  Steven Gardner is a 57 y.o. male who is consulted by [facility] for evaluation of above cc.  Noted on CT scan, but patient denies any symptoms except for epigastric pain that started the day prior to admission, similar in characteristic to past pancreatitis.  Sharp, non-radiating, asscoiated with nausea.  Last BM the day prior to admission, normal, no issues.   Past Medical History:  has a past medical history of Hepatitis C, Paresthesias, Spinal stenosis, and Stroke (HCC).  Past Surgical History:  has a past surgical history that includes Elbow surgery; Neck surgery; Knee surgery; Spine surgery; and Esophagogastroduodenoscopy (egd) with propofol (N/A, 11/08/2017).  Family History: family history includes Stroke in his paternal grandfather.  Social History:  reports that he has quit smoking. He has never used smokeless tobacco. He reports that he drinks alcohol. He reports that he does not use drugs.  Current Medications:  Medications Prior to Admission  Medication Sig Dispense Refill  . Multiple Vitamin (MULTIVITAMIN WITH MINERALS) TABS tablet Take 1 tablet by mouth daily. 180 tablet 0  . pantoprazole (PROTONIX) 40 MG tablet Take 1 tablet (40 mg total) by mouth daily. 60 tablet 1  . ranitidine (ZANTAC) 150 MG tablet Take 1 tablet (150 mg total) by mouth 2 (two) times daily. 60 tablet 2  . tamsulosin (FLOMAX) 0.4 MG CAPS capsule Take 1 capsule (0.4 mg total) by mouth daily. 30 capsule 2    Allergies:  Allergies as of 11/28/2017  . (No Known Allergies)    ROS:  A 15 point review of systems was performed and pertinent positives and negatives noted in HPI    Objective:     BP 136/70   Pulse 73   Temp 98.3 F (36.8 C) (Axillary)   Resp 17   Wt 86.2 kg   SpO2 95%   BMI 26.50 kg/m    Constitutional :  alert, cooperative, appears stated age and no distress  Lymphatics/Throat:  no asymmetry, masses, or scars  Respiratory:  clear to  auscultation bilaterally  Cardiovascular:  regular rate and rhythm  Gastrointestinal: focal tenderness in epigastrc but soft, non-distended.   Musculoskeletal: Steady gait and movement  Skin: Cool and moist  Psychiatric: Normal affect, non-agitated, not confused       LABS:  CMP Latest Ref Rng & Units 11/29/2017 11/28/2017 11/08/2017  Glucose 70 - 99 mg/dL 82 161(W) 960(A)  BUN 6 - 20 mg/dL 9 9 8   Creatinine 0.61 - 1.24 mg/dL 5.40 9.81 1.91  Sodium 135 - 145 mmol/L 143 144 138  Potassium 3.5 - 5.1 mmol/L 4.0 3.7 3.8  Chloride 98 - 111 mmol/L 104 103 103  CO2 22 - 32 mmol/L 26 25 27   Calcium 8.9 - 10.3 mg/dL 8.0(L) 8.7(L) 9.0  Total Protein 6.5 - 8.1 g/dL - 8.2(H) -  Total Bilirubin 0.3 - 1.2 mg/dL - 0.6 -  Alkaline Phos 38 - 126 U/L - 65 -  AST 15 - 41 U/L - 122(H) -  ALT 0 - 44 U/L - 92(H) -   CBC Latest Ref Rng & Units 11/29/2017 11/28/2017 11/08/2017  WBC 4.0 - 10.5 K/uL 7.5 12.7(H) 4.2  Hemoglobin 13.0 - 17.0 g/dL 12.8(L) 15.3 12.9(L)  Hematocrit 39.0 - 52.0 % 38.4(L) 44.4 38.8(L)  Platelets 150 - 400 K/uL 152 197 68(L)     RADS: CLINICAL DATA:  Generalized abdominal pain. History of pancreatitis.  EXAM: CT ABDOMEN AND PELVIS WITH CONTRAST  TECHNIQUE: Multidetector CT  imaging of the abdomen and pelvis was performed using the standard protocol following bolus administration of intravenous contrast.  CONTRAST:  OMNIPAQUE IOHEXOL 300 MG/ML  SOLN  COMPARISON:  KUB November 28, 2017  FINDINGS: Lower chest: No acute abnormality.  Hepatobiliary: Hepatic steatosis. Portal vein and gallbladder are unremarkable.  Pancreas: Unremarkable. No pancreatic ductal dilatation or surrounding inflammatory changes.  Spleen: Normal in size without focal abnormality.  Adrenals/Urinary Tract: The adrenal glands are normal. Right renal cyst. The kidneys, ureters, and bladder are otherwise normal.  Stomach/Bowel: The stomach is normal. There are multiple  prominent and mildly dilated loops of small bowel, particularly in the right lower quadrant. The transition point appears to be associated with thick walled loops of distal ileum in the right lower quadrant. Representative thick walled loops are seen on coronal image 39 and axial image 52. The small bowel is mildly dilated proximal to these thick walled loops. The colon is relatively decompressed compared to the small bowel. No colonic or appendix abnormalities.  Vascular/Lymphatic: Atherosclerosis in the abdominal aorta. No adenopathy. No aneurysm.  Reproductive: Prostate is unremarkable.  Other: No abdominal wall hernia or abnormality. No abdominopelvic ascites.  Musculoskeletal: No acute or significant osseous findings.  IMPRESSION: 1. Thick walled loops of distal ileum are nonspecific but most consistent with an enteritis which could be infectious, inflammatory, or ischemic. More proximal loops of small bowel are mildly dilated consistent with partial or early small bowel obstruction. 2. Hepatic steatosis. 3. Atherosclerosis in the abdominal aorta.  Assessment:      Alcohol induced pancreatitis, no sign of gallstone ileus on CT scan, and patient not presenting with bowel obstruction symptoms.  Plan:   Further management of pancreatitis per medical team.  No need for surgical intervention and surgery will sign off

## 2017-11-29 NOTE — Progress Notes (Signed)
Patient admitted with acute pancreatitis, alcohol induced. -Lipase is improving.  Requesting food.  States his pain and nausea are much improved.  Starting on clear liquid diet.

## 2017-11-29 NOTE — H&P (Signed)
[facility] - [place] at [facility]   PATIENT NAME: Steven Gardner    MR#:  [id number]  DATE OF BIRTH:  [birth year]  DATE OF ADMISSION:  11/28/2017  PRIMARY CARE PHYSICIAN: Kerman Passey, MD   REQUESTING/REFERRING PHYSICIAN: Mayford Knife, MD  CHIEF COMPLAINT:   Chief Complaint  Patient presents with  . Abdominal Pain    HISTORY OF PRESENT ILLNESS:  Steven Gardner  is a 57 y.o. male who presents with chief complaint as above.  Patient presents with acute onset epigastric abdominal pain with nausea and vomiting.  This started within the last 24 hours.  Patient has had prior episodes of alcohol induced pancreatitis.  Tonight in the ED his lipase was elevated, around 100.  Imaging is also consistent with possible ileus, the patient's primary complaints center around epigastric pain with nausea and vomiting.  Hospitalist were called for admission  PAST MEDICAL HISTORY:   Past Medical History:  Diagnosis Date  . Hepatitis C   . Paresthesias   . Spinal stenosis   . Stroke [facility])      PAST SURGICAL HISTORY:   Past Surgical History:  Procedure Laterality Date  . ELBOW SURGERY    . ESOPHAGOGASTRODUODENOSCOPY (EGD) WITH PROPOFOL N/A 11/08/2017   Procedure: ESOPHAGOGASTRODUODENOSCOPY (EGD) WITH PROPOFOL;  Surgeon: Midge Minium, MD;  Location: [facility] ENDOSCOPY;  Service: Endoscopy;  Laterality: N/A;  . KNEE SURGERY    . NECK SURGERY    . SPINE SURGERY       SOCIAL HISTORY:   Social History   Tobacco Use  . Smoking status: Former Games developer  . Smokeless tobacco: Never Used  Substance Use Topics  . Alcohol use: Yes    Comment: 8-10 40oz/beer daily     FAMILY HISTORY:   Family History  Problem Relation Age of Onset  . Stroke Paternal Grandfather      DRUG ALLERGIES:  No Known Allergies  MEDICATIONS AT HOME:   Prior to Admission medications   Medication Sig Start Date End Date Taking? Authorizing Provider  Multiple Vitamin (MULTIVITAMIN  WITH MINERALS) TABS tablet Take 1 tablet by mouth daily. 11/09/17   Salary, Evelena Asa, MD  pantoprazole (PROTONIX) 40 MG tablet Take 1 tablet (40 mg total) by mouth daily. 11/08/17   Salary, Evelena Asa, MD  ranitidine (ZANTAC) 150 MG tablet Take 1 tablet (150 mg total) by mouth 2 (two) times daily. Patient not taking: Reported on 11/07/2017 08/03/17   Kerman Passey, MD  tamsulosin (FLOMAX) 0.4 MG CAPS capsule Take 1 capsule (0.4 mg total) by mouth daily. 08/03/17   Kerman Passey, MD    REVIEW OF SYSTEMS:  Review of Systems  Constitutional: Negative for chills, fever, malaise/fatigue and weight loss.  HENT: Negative for ear pain, hearing loss and tinnitus.   Eyes: Negative for blurred vision, double vision, pain and redness.  Respiratory: Negative for cough, hemoptysis and shortness of breath.   Cardiovascular: Negative for chest pain, palpitations, orthopnea and leg swelling.  Gastrointestinal: Positive for abdominal pain, nausea and vomiting. Negative for constipation and diarrhea.  Genitourinary: Negative for dysuria, frequency and hematuria.  Musculoskeletal: Negative for back pain, joint pain and neck pain.  Skin:       No acne, rash, or lesions  Neurological: Negative for dizziness, tremors, focal weakness and weakness.  Endo/Heme/Allergies: Negative for polydipsia. Does not bruise/bleed easily.  Psychiatric/Behavioral: Negative for depression. The patient is not nervous/anxious and does not have insomnia.      VITAL SIGNS:  Vitals:   11/28/17 2020 11/28/17 2030 11/28/17 2100 11/28/17 2130  BP: (!) 122/94 138/79 123/76 140/78  Pulse: 74 70 66 77  Resp: 18 16 15 17   Temp: 98.1 F (36.7 C)     TempSrc: Oral     SpO2: 99% 96% 93% 100%  Weight:       Wt Readings from Last 3 Encounters:  11/28/17 86.2 kg  11/08/17 86.2 kg  08/26/17 93.3 kg    PHYSICAL EXAMINATION:  Physical Exam  Vitals reviewed. Constitutional: He is oriented to person, place, and time. He appears  well-developed and well-nourished. No distress.  HENT:  Head: Normocephalic and atraumatic.  Mouth/Throat: Oropharynx is clear and moist.  Eyes: Pupils are equal, round, and reactive to light. Conjunctivae and EOM are normal. No scleral icterus.  Neck: Normal range of motion. Neck supple. No JVD present. No thyromegaly present.  Cardiovascular: Normal rate, regular rhythm and intact distal pulses. Exam reveals no gallop and no friction rub.  No murmur heard. Respiratory: Effort normal and breath sounds normal. No respiratory distress. He has no wheezes. He has no rales.  GI: Soft. Bowel sounds are normal. He exhibits no distension. There is tenderness.  Musculoskeletal: Normal range of motion. He exhibits no edema.  No arthritis, no gout  Lymphadenopathy:    He has no cervical adenopathy.  Neurological: He is alert and oriented to person, place, and time. No cranial nerve deficit.  No dysarthria, no aphasia  Skin: Skin is warm and dry. No rash noted. No erythema.  Psychiatric: He has a normal mood and affect. His behavior is normal. Judgment and thought content normal.    LABORATORY PANEL:   CBC Recent Labs  Lab 11/28/17 2021  WBC 12.7*  HGB 15.3  HCT 44.4  PLT 197   ------------------------------------------------------------------------------------------------------------------  Chemistries  Recent Labs  Lab 11/28/17 2021  NA 144  K 3.7  CL 103  CO2 25  GLUCOSE 103*  BUN 9  CREATININE 0.80  CALCIUM 8.7*  AST 122*  ALT 92*  ALKPHOS 65  BILITOT 0.6   ------------------------------------------------------------------------------------------------------------------  Cardiac Enzymes Recent Labs  Lab 11/28/17 2021  TROPONINI <0.03   ------------------------------------------------------------------------------------------------------------------  RADIOLOGY:  Ct Abdomen Pelvis W Contrast  Result Date: 11/28/2017 CLINICAL DATA:  Generalized abdominal pain.  History of pancreatitis. EXAM: CT ABDOMEN AND PELVIS WITH CONTRAST TECHNIQUE: Multidetector CT imaging of the abdomen and pelvis was performed using the standard protocol following bolus administration of intravenous contrast. CONTRAST:  OMNIPAQUE IOHEXOL 300 MG/ML  SOLN COMPARISON:  KUB November 28, 2017 FINDINGS: Lower chest: No acute abnormality. Hepatobiliary: Hepatic steatosis. Portal vein and gallbladder are unremarkable. Pancreas: Unremarkable. No pancreatic ductal dilatation or surrounding inflammatory changes. Spleen: Normal in size without focal abnormality. Adrenals/Urinary Tract: The adrenal glands are normal. Right renal cyst. The kidneys, ureters, and bladder are otherwise normal. Stomach/Bowel: The stomach is normal. There are multiple prominent and mildly dilated loops of small bowel, particularly in the right lower quadrant. The transition point appears to be associated with thick walled loops of distal ileum in the right lower quadrant. Representative thick walled loops are seen on coronal image 39 and axial image 52. The small bowel is mildly dilated proximal to these thick walled loops. The colon is relatively decompressed compared to the small bowel. No colonic or appendix abnormalities. Vascular/Lymphatic: Atherosclerosis in the abdominal aorta. No adenopathy. No aneurysm. Reproductive: Prostate is unremarkable. Other: No abdominal wall hernia or abnormality. No abdominopelvic ascites. Musculoskeletal: No acute or significant osseous  findings. IMPRESSION: 1. Thick walled loops of distal ileum are nonspecific but most consistent with an enteritis which could be infectious, inflammatory, or ischemic. More proximal loops of small bowel are mildly dilated consistent with partial or early small bowel obstruction. 2. Hepatic steatosis. 3. Atherosclerosis in the abdominal aorta. Electronically Signed   By: Gerome Sam III M.D   On: 11/28/2017 22:15   Dg Abd 2 Views  Result Date:  11/28/2017 CLINICAL DATA:  Generalized abdominal pain EXAM: ABDOMEN - 2 VIEW COMPARISON:  None. FINDINGS: Scattered large and small bowel gas is noted. The small bowel is mildly prominent at 3.6 cm. These changes may represent ileus or early partial small bowel obstruction. Correlation with the physical exam is recommended. IMPRESSION: Small-bowel dilatation suggestive of early partial small bowel obstruction or ileus. Correlate with the physical exam. CT may be helpful for further evaluation. Electronically Signed   By: Alcide Clever M.D.   On: 11/28/2017 21:14    EKG:   Orders placed or performed during the hospital encounter of 11/28/17  . EKG 12-Lead  . EKG 12-Lead    IMPRESSION AND PLAN:  Principal Problem:   Alcohol induced acute pancreatitis -IV fluids ordered, PRN analgesia and antiemetics, discussed with patient the importance of alcohol cessation given his recurrent alcohol induced pancreatitis episodes among other things.  N.p.o. Active Problems:   Ileus (HCC) -n.p.o. for now, likely stone bowel in the setting of pancreatitis, ED physician states that surgical consultant is coming to see the patient to assess for any potential surgical need, though this is probably less likely   Benign prostatic hyperplasia with urinary frequency -continue home meds  Chart review performed and case discussed with ED provider. Labs, imaging and/or ECG reviewed by provider and discussed with patient/family. Management plans discussed with the patient and/or family.  DVT PROPHYLAXIS: SubQ lovenox   GI PROPHYLAXIS:  PPI   ADMISSION STATUS: Inpatient     CODE STATUS: Full Code Status History    Date Active Date Inactive Code Status Order ID Comments User Context   11/07/2017 1622 11/09/2017 2036 Full Code [id number]  Altamese Dilling, MD Inpatient      TOTAL TIME TAKING CARE OF THIS PATIENT: 45 minutes.   Lebert Lovern FIELDING 11/29/2017, 12:45 AM  [facility]  Office   [phone]  CC: Primary care physician; Kerman Passey, MD  Note:  This document was prepared using Dragon voice recognition software and may include unintentional dictation errors.

## 2017-11-29 NOTE — Progress Notes (Signed)
Chaplain responded to an OR for and AD. Chaplain educated Pt on AD and left brochure. Chaplain prayed for the Pt to get better.    11/29/17 0700  Clinical Encounter Type  Visited With Patient  Visit Type Initial  Referral From Nurse  Spiritual Encounters  Spiritual Needs Brochure

## 2017-11-29 NOTE — Progress Notes (Signed)
[facility] - [place] at [facility]   PATIENT NAME: Steven Gardner    MR#:  [id number]  DATE OF BIRTH:  09-23-1960  SUBJECTIVE:   Patient came in with abdominal pain with nausea vomiting. He has history of alcohol abuse. He stopped drinking hard liquor but continues to drink beer. REVIEW OF SYSTEMS:   Review of Systems  Constitutional: Negative for chills, fever and weight loss.  HENT: Negative for ear discharge, ear pain and nosebleeds.   Eyes: Negative for blurred vision, pain and discharge.  Respiratory: Negative for sputum production, shortness of breath, wheezing and stridor.   Cardiovascular: Negative for chest pain, palpitations, orthopnea and PND.  Gastrointestinal: Positive for abdominal pain. Negative for diarrhea, nausea and vomiting.  Genitourinary: Negative for frequency and urgency.  Musculoskeletal: Negative for back pain and joint pain.  Neurological: Positive for tremors. Negative for sensory change, speech change, focal weakness and weakness.  Psychiatric/Behavioral: Negative for depression and hallucinations. The patient is not nervous/anxious.    Tolerating Diet:yes Tolerating PT: not needed  DRUG ALLERGIES:  No Known Allergies  VITALS:  Blood pressure 139/82, pulse 78, temperature 98 F (36.7 C), temperature source Oral, resp. rate 18, weight 86.2 kg, SpO2 98 %.  PHYSICAL EXAMINATION:   Physical Exam  GENERAL:  56 y.o.-year-old patient lying in the bed with no acute distress. Morbid obese EYES: Pupils equal, round, reactive to light and accommodation. No scleral icterus. Extraocular muscles intact.  HEENT: Head atraumatic, normocephalic. Oropharynx and nasopharynx clear.  NECK:  Supple, no jugular venous distention. No thyroid enlargement, no tenderness.  LUNGS: Normal breath sounds bilaterally, no wheezing, rales, rhonchi. No use of accessory muscles of respiration.  CARDIOVASCULAR: S1, S2 normal. No murmurs, rubs, or  gallops.  ABDOMEN: Soft, diffusely tender, nondistended. Bowel sounds present. No organomegaly or mass.  EXTREMITIES: No cyanosis, clubbing or edema b/l.    NEUROLOGIC: Cranial nerves II through XII are intact. No focal Motor or sensory deficits b/l.   PSYCHIATRIC:  patient is alert and oriented x 3. Anxious SKIN: No obvious rash, lesion, or ulcer.   LABORATORY PANEL:  CBC Recent Labs  Lab 11/29/17 0528  WBC 7.5  HGB 12.8*  HCT 38.4*  PLT 152    Chemistries  Recent Labs  Lab 11/28/17 2021 11/29/17 0528  NA 144 143  K 3.7 4.0  CL 103 104  CO2 25 26  GLUCOSE 103* 82  BUN 9 9  CREATININE 0.80 0.85  CALCIUM 8.7* 8.0*  AST 122*  --   ALT 92*  --   ALKPHOS 65  --   BILITOT 0.6  --    Cardiac Enzymes Recent Labs  Lab 11/28/17 2021  TROPONINI <0.03   RADIOLOGY:  Ct Abdomen Pelvis W Contrast  Result Date: 11/28/2017 CLINICAL DATA:  Generalized abdominal pain. History of pancreatitis. EXAM: CT ABDOMEN AND PELVIS WITH CONTRAST TECHNIQUE: Multidetector CT imaging of the abdomen and pelvis was performed using the standard protocol following bolus administration of intravenous contrast. CONTRAST:  OMNIPAQUE IOHEXOL 300 MG/ML  SOLN COMPARISON:  KUB November 28, 2017 FINDINGS: Lower chest: No acute abnormality. Hepatobiliary: Hepatic steatosis. Portal vein and gallbladder are unremarkable. Pancreas: Unremarkable. No pancreatic ductal dilatation or surrounding inflammatory changes. Spleen: Normal in size without focal abnormality. Adrenals/Urinary Tract: The adrenal glands are normal. Right renal cyst. The kidneys, ureters, and bladder are otherwise normal. Stomach/Bowel: The stomach is normal. There are multiple prominent and mildly dilated loops of small bowel, particularly in the right lower  quadrant. The transition point appears to be associated with thick walled loops of distal ileum in the right lower quadrant. Representative thick walled loops are seen on coronal image 39 and  axial image 52. The small bowel is mildly dilated proximal to these thick walled loops. The colon is relatively decompressed compared to the small bowel. No colonic or appendix abnormalities. Vascular/Lymphatic: Atherosclerosis in the abdominal aorta. No adenopathy. No aneurysm. Reproductive: Prostate is unremarkable. Other: No abdominal wall hernia or abnormality. No abdominopelvic ascites. Musculoskeletal: No acute or significant osseous findings. IMPRESSION: 1. Thick walled loops of distal ileum are nonspecific but most consistent with an enteritis which could be infectious, inflammatory, or ischemic. More proximal loops of small bowel are mildly dilated consistent with partial or early small bowel obstruction. 2. Hepatic steatosis. 3. Atherosclerosis in the abdominal aorta. Electronically Signed   By: Gerome Sam III M.D   On: 11/28/2017 22:15   Dg Abd 2 Views  Result Date: 11/28/2017 CLINICAL DATA:  Generalized abdominal pain EXAM: ABDOMEN - 2 VIEW COMPARISON:  None. FINDINGS: Scattered large and small bowel gas is noted. The small bowel is mildly prominent at 3.6 cm. These changes may represent ileus or early partial small bowel obstruction. Correlation with the physical exam is recommended. IMPRESSION: Small-bowel dilatation suggestive of early partial small bowel obstruction or ileus. Correlate with the physical exam. CT may be helpful for further evaluation. Electronically Signed   By: Alcide Clever M.D.   On: 11/28/2017 21:14   ASSESSMENT AND PLAN:  Steven Gardner  is a 57 y.o. male who presents with chief complaint abdominal pain  Patient presents with acute onset epigastric abdominal pain with nausea and vomiting.  This started within the last 24 hours.  Patient has had prior episodes of alcohol induced pancreatitis.  Tonight in the ED his lipase was elevated, around 100  *Alcohol induced acute pancreatitis -IV fluids ordered, PRN analgesia and antiemetics, discussed with patient the  importance of alcohol cessation given his recurrent alcohol induced pancreatitis episodes among other things.  N.p.o. for now. -Lipase down to 72. LFTs a bit better.  *  Ileus (HCC) -n.p.o. for now, likely stone bowel in the setting of pancreatitis -seen by surgery signed off no surgical indication   *  Benign prostatic hyperplasia with urinary frequency -continue home meds  *chronic alcohol abuse. Seen by Child psychotherapist. Continue IV Ativan    Case discussed with Care Management/Social Worker. Management plans discussed with the patient, family and they are in agreement.  CODE STATUS: full  DVT Prophylaxis: Lovenox  TOTAL TIME TAKING CARE OF THIS PATIENT: 30* minutes.  >50% time spent on counselling and coordination of care  POSSIBLE D/C IN ** DAYS, DEPENDING ON CLINICAL CONDITION.  Note: This dictation was prepared with Dragon dictation along with smaller phrase technology. Any transcriptional errors that result from this process are unintentional.  Enedina Finner M.D on 11/29/2017 at 6:13 PM  Between 7am to 6pm - Pager - [phone]  After 6pm go to [email or web address] - Social research officer, government  [facility]  Office  [phone]  CC: Primary care physician; Kerman Passey, MDPatient ID: Earlyne Iba, male   DOB: [birth year], 57 y.o.   MRN: [id number]

## 2017-12-01 ENCOUNTER — Emergency Department
Admission: EM | Admit: 2017-12-01 | Discharge: 2017-12-02 | Disposition: A | Discharge status: Home / Self Care | Payer: Medicaid Other | Attending: Emergency Medicine | Admitting: Emergency Medicine

## 2017-12-01 ENCOUNTER — Encounter: Payer: Self-pay | Admitting: Emergency Medicine

## 2017-12-01 DIAGNOSIS — Z87891 Personal history of nicotine dependence: Secondary | ICD-10-CM | POA: Diagnosis not present

## 2017-12-01 DIAGNOSIS — F10920 Alcohol use, unspecified with intoxication, uncomplicated: Secondary | ICD-10-CM | POA: Insufficient documentation

## 2017-12-01 DIAGNOSIS — E119 Type 2 diabetes mellitus without complications: Secondary | ICD-10-CM | POA: Diagnosis not present

## 2017-12-01 DIAGNOSIS — Y908 Blood alcohol level of 240 mg/100 ml or more: Secondary | ICD-10-CM | POA: Diagnosis not present

## 2017-12-01 DIAGNOSIS — Z79899 Other long term (current) drug therapy: Secondary | ICD-10-CM | POA: Diagnosis not present

## 2017-12-01 NOTE — ED Triage Notes (Signed)
Pt in via [employer]. Per officer pt was under arrest but his alcohol was so high he had to come here for medical clearance. Pt with slurred speech and not answering all questions.

## 2017-12-02 LAB — COMPREHENSIVE METABOLIC PANEL
ALT: 84 U/L — AB (ref 0–44)
ANION GAP: 12 (ref 5–15)
AST: 140 U/L — AB (ref 15–41)
Albumin: 4.6 g/dL (ref 3.5–5.0)
Alkaline Phosphatase: 63 U/L (ref 38–126)
BILIRUBIN TOTAL: 0.7 mg/dL (ref 0.3–1.2)
BUN: 5 mg/dL — ABNORMAL LOW (ref 6–20)
CO2: 24 mmol/L (ref 22–32)
Calcium: 8.9 mg/dL (ref 8.9–10.3)
Chloride: 104 mmol/L (ref 98–111)
Creatinine, Ser: 0.74 mg/dL (ref 0.61–1.24)
Glucose, Bld: 107 mg/dL — ABNORMAL HIGH (ref 70–99)
POTASSIUM: 3.6 mmol/L (ref 3.5–5.1)
Sodium: 140 mmol/L (ref 135–145)
TOTAL PROTEIN: 8.3 g/dL — AB (ref 6.5–8.1)

## 2017-12-02 LAB — URINE DRUG SCREEN, QUALITATIVE (ARMC ONLY)
AMPHETAMINES, UR SCREEN: NOT DETECTED
Barbiturates, Ur Screen: NOT DETECTED
Benzodiazepine, Ur Scrn: NOT DETECTED
Cannabinoid 50 Ng, Ur ~~LOC~~: NOT DETECTED
Cocaine Metabolite,Ur ~~LOC~~: NOT DETECTED
MDMA (ECSTASY) UR SCREEN: NOT DETECTED
METHADONE SCREEN, URINE: NOT DETECTED
Opiate, Ur Screen: NOT DETECTED
PHENCYCLIDINE (PCP) UR S: NOT DETECTED
TRICYCLIC, UR SCREEN: NOT DETECTED

## 2017-12-02 LAB — ETHANOL: Alcohol, Ethyl (B): 403 mg/dL (ref <10)

## 2017-12-02 LAB — CBC
HCT: 44.1 % (ref 39.0–52.0)
Hemoglobin: 15.1 g/dL (ref 13.0–17.0)
MCH: 33 pg (ref 26.0–34.0)
MCHC: 34.2 g/dL (ref 30.0–36.0)
MCV: 96.3 fL (ref 80.0–100.0)
NRBC: 0 % (ref 0.0–0.2)
PLATELETS: 132 10*3/uL — AB (ref 150–400)
RBC: 4.58 MIL/uL (ref 4.22–5.81)
RDW: 11.7 % (ref 11.5–15.5)
WBC: 6.1 10*3/uL (ref 4.0–10.5)

## 2017-12-02 MED ORDER — ONDANSETRON HCL 4 MG/2ML IJ SOLN
INTRAMUSCULAR | Status: AC
  Administered 2017-12-02: 4 mg via INTRAVENOUS
  Filled 2017-12-02: qty 2

## 2017-12-02 MED ORDER — ONDANSETRON HCL 4 MG/2ML IJ SOLN
4.0000 mg | Freq: Once | INTRAMUSCULAR | Status: AC
  Administered 2017-12-02: 4 mg via INTRAVENOUS

## 2017-12-02 MED ORDER — SODIUM CHLORIDE 0.9 % IV BOLUS
1000.0000 mL | Freq: Once | INTRAVENOUS | Status: AC
  Administered 2017-12-02: 1000 mL via INTRAVENOUS

## 2017-12-02 NOTE — ED Notes (Signed)
Pt reports upper abdominal pain. States that he has had about 6 -7 beers tonight. Pt is laying with eyes closed and hand hand cuffed to bed. Officer is at the bedside.

## 2017-12-02 NOTE — ED Notes (Signed)
Date and time results received: 12/02/17 0036 (use smartphrase ".now" to insert current time)  Test: ETOH Critical Value: 403  Name of Provider Notified: Dr. Manson Passey  Orders Received? Or Actions Taken?:

## 2017-12-02 NOTE — ED Provider Notes (Signed)
[facility] Emergency Department Provider Note   First MD Initiated Contact with Patient 12/02/17 0012     (approximate)  I have reviewed the triage vital signs and the nursing notes.   HISTORY  Chief Complaint Alcohol Intoxication and Medical Clearance    HPI Steven Gardner is a 57 y.o. male presents to the emergency department in police custody.  Please officer states that the patient was brought from jail secondary to alcohol level being too high".  Patient states that he drank 6 beers today.  Patient denies any other complaints.  Patient not answering any other questions at this time.   Past Medical History:  Diagnosis Date  . Hepatitis C   . Paresthesias   . Spinal stenosis   . Stroke [facility])     Patient Active Problem List   Diagnosis Date Noted  . Alcohol induced acute pancreatitis 11/29/2017  . Ileus (HCC) 11/29/2017  . Alcohol-induced pancreatitis 11/29/2017  . Acute gastritis without hemorrhage   . Hematemesis 11/07/2017  . Benign prostatic hyperplasia with urinary frequency 08/12/2017  . Type 2 diabetes mellitus (HCC) 08/03/2017  . Hx of hepatitis C 08/03/2017  . Chronic lower back pain 08/03/2017  . Cervical radiculopathy at C6 08/03/2017    Past Surgical History:  Procedure Laterality Date  . ELBOW SURGERY    . ESOPHAGOGASTRODUODENOSCOPY (EGD) WITH PROPOFOL N/A 11/08/2017   Procedure: ESOPHAGOGASTRODUODENOSCOPY (EGD) WITH PROPOFOL;  Surgeon: Midge Minium, MD;  Location: [facility] ENDOSCOPY;  Service: Endoscopy;  Laterality: N/A;  . KNEE SURGERY    . NECK SURGERY    . SPINE SURGERY      Prior to Admission medications   Medication Sig Start Date End Date Taking? Authorizing Provider  Multiple Vitamin (MULTIVITAMIN WITH MINERALS) TABS tablet Take 1 tablet by mouth daily. 11/09/17   Salary, Evelena Asa, MD  pantoprazole (PROTONIX) 40 MG tablet Take 1 tablet (40 mg total) by mouth daily. 11/08/17   Salary, Evelena Asa, MD  ranitidine  (ZANTAC) 150 MG tablet Take 1 tablet (150 mg total) by mouth 2 (two) times daily. 08/03/17   Kerman Passey, MD  tamsulosin (FLOMAX) 0.4 MG CAPS capsule Take 1 capsule (0.4 mg total) by mouth daily. 08/03/17   Kerman Passey, MD    Allergies No known drug allergies  Family History  Problem Relation Age of Onset  . Stroke Paternal Grandfather     Social History Social History   Tobacco Use  . Smoking status: Former Games developer  . Smokeless tobacco: Never Used  Substance Use Topics  . Alcohol use: Yes    Comment: 8-10 40oz/beer daily  . Drug use: No    Review of Systems Constitutional: No fever/chills Eyes: No visual changes. ENT: No sore throat. Cardiovascular: Denies chest pain. Respiratory: Denies shortness of breath. Gastrointestinal: No abdominal pain.  No nausea, no vomiting.  No diarrhea.  No constipation. Genitourinary: Negative for dysuria. Musculoskeletal: Negative for neck pain.  Negative for back pain. Integumentary: Negative for rash. Neurological: Negative for headaches, focal weakness or numbness. Psychiatric:Positive for alcohol intoxication  ____________________________________________   PHYSICAL EXAM:  VITAL SIGNS: ED Triage Vitals  Enc Vitals Group     BP 12/01/17 2354 110/77     Pulse Rate 12/01/17 2349 88     Resp 12/01/17 2349 19     Temp 12/01/17 2349 98.1 F (36.7 C)     Temp Source 12/01/17 2349 Oral     SpO2 12/01/17 2349 94 %  Weight 12/01/17 2355 82.6 kg (182 lb)     Height 12/01/17 2355 1.803 m (5\' 11" )     Head Circumference --      Peak Flow --      Pain Score 12/01/17 2355 0     Pain Loc --      Pain Edu? --      Excl. in GC? --     Constitutional: Alert and oriented.  Appears intoxicated  eyes: Conjunctivae are normal. PERRL. EOMI. Head: Atraumatic.Marland Kitchen Mouth/Throat: Mucous membranes are moist.  Oropharynx non-erythematous. Neck: No stridor.  Cardiovascular: Normal rate, regular rhythm. Good peripheral circulation. Grossly  normal heart sounds. Respiratory: Normal respiratory effort.  No retractions. Lungs CTAB. Gastrointestinal: Soft and nontender. No distention.  Musculoskeletal: No lower extremity tenderness nor edema. No gross deformities of extremities. Neurologic: Slurred speech. No gross focal neurologic deficits are appreciated.  Skin:  Skin is warm, dry and intact. No rash noted. Psychiatric: Appears intoxicated  ____________________________________________   LABS (all labs ordered are listed, but only abnormal results are displayed)  Labs Reviewed  COMPREHENSIVE METABOLIC PANEL - Abnormal; Notable for the following components:      Result Value   Glucose, Bld 107 (*)    BUN 5 (*)    Total Protein 8.3 (*)    AST 140 (*)    ALT 84 (*)    All other components within normal limits  ETHANOL - Abnormal; Notable for the following components:   Alcohol, Ethyl (B) 403 (*)    All other components within normal limits  CBC - Abnormal; Notable for the following components:   Platelets 132 (*)    All other components within normal limits  URINE DRUG SCREEN, QUALITATIVE ([facility] ONLY)     Procedures   ____________________________________________   INITIAL IMPRESSION / ASSESSMENT AND PLAN / ED COURSE  As part of my medical decision making, I reviewed the following data within the electronic MEDICAL RECORD NUMBER  57 year old male presenting to the emergency department in police custody secondary to alcohol intoxication.  Patient's alcohol level noted to be 403.  Patient was observed in the emergency department for approximately 4 hours and remained stable during that time.  Patient alert and oriented at this time.  Patient will be discharged in police custody at this time.     ____________________________________________  FINAL CLINICAL IMPRESSION(S) / ED DIAGNOSES  Final diagnoses:  Alcoholic intoxication without complication (HCC)     MEDICATIONS GIVEN DURING THIS VISIT:  Medications    ondansetron (ZOFRAN) injection 4 mg (4 mg Intravenous Given 12/02/17 0057)  sodium chloride 0.9 % bolus 1,000 mL (0 mLs Intravenous Stopped 12/02/17 0329)     ED Discharge Orders    None       Note:  This document was prepared using Dragon voice recognition software and may include unintentional dictation errors.    Darci Current, MD 12/02/17 [phone]

## 2017-12-14 NOTE — Discharge Summary (Signed)
Pt left AMA on 11/29/2017. HE was admitted with Alcohol induced Pancreatitis.  No meds or discharge instructions given since pt left AMA

## 2018-01-09 ENCOUNTER — Encounter: Payer: Self-pay | Admitting: Emergency Medicine

## 2018-01-09 ENCOUNTER — Emergency Department
Admission: EM | Admit: 2018-01-09 | Discharge: 2018-01-09 | Discharge status: Against Medical Advice | Payer: Medicaid Other | Attending: Emergency Medicine | Admitting: Emergency Medicine

## 2018-01-09 DIAGNOSIS — M542 Cervicalgia: Secondary | ICD-10-CM | POA: Diagnosis not present

## 2018-01-09 DIAGNOSIS — Z5321 Procedure and treatment not carried out due to patient leaving prior to being seen by health care provider: Secondary | ICD-10-CM | POA: Insufficient documentation

## 2018-01-09 DIAGNOSIS — M549 Dorsalgia, unspecified: Secondary | ICD-10-CM | POA: Diagnosis present

## 2018-01-09 DIAGNOSIS — G8929 Other chronic pain: Secondary | ICD-10-CM | POA: Insufficient documentation

## 2018-01-09 LAB — COMPREHENSIVE METABOLIC PANEL
ALK PHOS: 50 U/L (ref 38–126)
ALT: 40 U/L (ref 0–44)
ANION GAP: 10 (ref 5–15)
AST: 45 U/L — AB (ref 15–41)
Albumin: 4.7 g/dL (ref 3.5–5.0)
BILIRUBIN TOTAL: 0.3 mg/dL (ref 0.3–1.2)
BUN: 14 mg/dL (ref 6–20)
CALCIUM: 9 mg/dL (ref 8.9–10.3)
CO2: 21 mmol/L — ABNORMAL LOW (ref 22–32)
Chloride: 112 mmol/L — ABNORMAL HIGH (ref 98–111)
Creatinine, Ser: 0.93 mg/dL (ref 0.61–1.24)
GFR calc Af Amer: >60 mL/min (ref >60)
GLUCOSE: 101 mg/dL — AB (ref 70–99)
Potassium: 4 mmol/L (ref 3.5–5.1)
Sodium: 143 mmol/L (ref 135–145)
TOTAL PROTEIN: 8.2 g/dL — AB (ref 6.5–8.1)

## 2018-01-09 LAB — CBC
HCT: 45.5 % (ref 39.0–52.0)
Hemoglobin: 15.6 g/dL (ref 13.0–17.0)
MCH: 31.9 pg (ref 26.0–34.0)
MCHC: 34.3 g/dL (ref 30.0–36.0)
MCV: 93 fL (ref 80.0–100.0)
Platelets: 171 10*3/uL (ref 150–400)
RBC: 4.89 MIL/uL (ref 4.22–5.81)
RDW: 11.4 % — AB (ref 11.5–15.5)
WBC: 7.2 10*3/uL (ref 4.0–10.5)
nRBC: 0 % (ref 0.0–0.2)

## 2018-01-09 LAB — URINE DRUG SCREEN, QUALITATIVE (ARMC ONLY)
Amphetamines, Ur Screen: NOT DETECTED
BARBITURATES, UR SCREEN: NOT DETECTED
BENZODIAZEPINE, UR SCRN: NOT DETECTED
CANNABINOID 50 NG, UR ~~LOC~~: NOT DETECTED
Cocaine Metabolite,Ur ~~LOC~~: NOT DETECTED
MDMA (ECSTASY) UR SCREEN: NOT DETECTED
Methadone Scn, Ur: NOT DETECTED
Opiate, Ur Screen: NOT DETECTED
PHENCYCLIDINE (PCP) UR S: NOT DETECTED
TRICYCLIC, UR SCREEN: NOT DETECTED

## 2018-01-09 LAB — ETHANOL: ALCOHOL ETHYL (B): 345 mg/dL — AB (ref <10)

## 2018-01-09 NOTE — ED Triage Notes (Signed)
First Nurse Note:  Back pain.  Patient states he has chronic back pain issues, but pain has worsened over the past few days.  Patient unable to get in and out of the car independently and is unable to dress himself.  Patient states he does not want narcotic pain medication.  C/O high neck pain.

## 2018-01-09 NOTE — ED Triage Notes (Signed)
Pt to ED with c/o of back pain that is chronic. Pt states he was recently approved for disability. Pt states he self medicates with alcohol. Pt has consumed an unknown amount of EOTH.

## 2018-03-01 ENCOUNTER — Ambulatory Visit: Payer: Medicaid Other

## 2018-03-22 ENCOUNTER — Ambulatory Visit: Payer: Medicaid Other

## 2018-05-25 ENCOUNTER — Emergency Department
Admission: EM | Admit: 2018-05-25 | Discharge: 2018-05-25 | Disposition: A | Discharge status: Home / Self Care | Attending: Emergency Medicine | Admitting: Emergency Medicine

## 2018-05-25 ENCOUNTER — Other Ambulatory Visit: Payer: Self-pay

## 2018-05-25 DIAGNOSIS — Z79899 Other long term (current) drug therapy: Secondary | ICD-10-CM | POA: Diagnosis not present

## 2018-05-25 DIAGNOSIS — F10239 Alcohol dependence with withdrawal, unspecified: Secondary | ICD-10-CM | POA: Diagnosis not present

## 2018-05-25 DIAGNOSIS — Z87891 Personal history of nicotine dependence: Secondary | ICD-10-CM | POA: Diagnosis not present

## 2018-05-25 DIAGNOSIS — E119 Type 2 diabetes mellitus without complications: Secondary | ICD-10-CM | POA: Diagnosis not present

## 2018-05-25 DIAGNOSIS — Z8673 Personal history of transient ischemic attack (TIA), and cerebral infarction without residual deficits: Secondary | ICD-10-CM | POA: Diagnosis not present

## 2018-05-25 DIAGNOSIS — F10230 Alcohol dependence with withdrawal, uncomplicated: Secondary | ICD-10-CM

## 2018-05-25 DIAGNOSIS — R251 Tremor, unspecified: Secondary | ICD-10-CM | POA: Diagnosis present

## 2018-05-25 DIAGNOSIS — F10930 Alcohol use, unspecified with withdrawal, uncomplicated: Secondary | ICD-10-CM

## 2018-05-25 LAB — COMPREHENSIVE METABOLIC PANEL
ALT: 74 U/L — ABNORMAL HIGH (ref 0–44)
AST: 106 U/L — ABNORMAL HIGH (ref 15–41)
Albumin: 4.7 g/dL (ref 3.5–5.0)
Alkaline Phosphatase: 54 U/L (ref 38–126)
Anion gap: 14 (ref 5–15)
BUN: 17 mg/dL (ref 6–20)
CO2: 22 mmol/L (ref 22–32)
Calcium: 9.5 mg/dL (ref 8.9–10.3)
Chloride: 105 mmol/L (ref 98–111)
Creatinine, Ser: 0.88 mg/dL (ref 0.61–1.24)
GFR calc Af Amer: >60 mL/min (ref >60)
GFR calc non Af Amer: >60 mL/min (ref >60)
Glucose, Bld: 119 mg/dL — ABNORMAL HIGH (ref 70–99)
Potassium: 3.8 mmol/L (ref 3.5–5.1)
Sodium: 141 mmol/L (ref 135–145)
Total Bilirubin: 2 mg/dL — ABNORMAL HIGH (ref 0.3–1.2)
Total Protein: 8.7 g/dL — ABNORMAL HIGH (ref 6.5–8.1)

## 2018-05-25 LAB — CBC
HCT: 43.2 % (ref 39.0–52.0)
Hemoglobin: 14.7 g/dL (ref 13.0–17.0)
MCH: 33.9 pg (ref 26.0–34.0)
MCHC: 34 g/dL (ref 30.0–36.0)
MCV: 99.5 fL (ref 80.0–100.0)
Platelets: 110 10*3/uL — ABNORMAL LOW (ref 150–400)
RBC: 4.34 MIL/uL (ref 4.22–5.81)
RDW: 12.8 % (ref 11.5–15.5)
WBC: 6.5 10*3/uL (ref 4.0–10.5)
nRBC: 0 % (ref 0.0–0.2)

## 2018-05-25 MED ORDER — VITAMIN B-1 100 MG PO TABS: 100.0000 mg | ORAL_TABLET | Freq: Every day | ORAL | Status: DC

## 2018-05-25 MED ORDER — LORAZEPAM 2 MG PO TABS: 0.0000 mg | ORAL_TABLET | Freq: Four times a day (QID) | ORAL | Status: DC

## 2018-05-25 MED ORDER — LORAZEPAM 2 MG PO TABS: 0.0000 mg | ORAL_TABLET | Freq: Two times a day (BID) | ORAL | Status: DC

## 2018-05-25 MED ORDER — LORAZEPAM 2 MG/ML IJ SOLN: 0.0000 mg | Freq: Two times a day (BID) | INTRAMUSCULAR | Status: DC

## 2018-05-25 MED ORDER — LORAZEPAM 2 MG/ML IJ SOLN
0.0000 mg | Freq: Four times a day (QID) | INTRAMUSCULAR | Status: DC
  Administered 2018-05-25 (×2): 1 mg via INTRAVENOUS
  Filled 2018-05-25: qty 1

## 2018-05-25 MED ORDER — THIAMINE HCL 100 MG/ML IJ SOLN: 100.0000 mg | Freq: Every day | INTRAMUSCULAR | Status: DC

## 2018-05-25 NOTE — ED Triage Notes (Addendum)
Pt is prisoner brought in by [employer] for alcohol withdrawals. States has been In jail for few days and normally drinks 6-8 beers a day.

## 2018-05-25 NOTE — ED Notes (Signed)
Pt is prisoner brought in by [employer] for depression, and withdrawals from alcohol. States has been In jail. Drinks 6-8 beers a day. Police at bedside states that the jail nurse was concerned about possible DT's from withdrawals. Pt states he has a hx of the same.

## 2018-05-25 NOTE — ED Notes (Signed)
Patient discharged to [facility] per MD order. Patient in stable condition, and deemed medically cleared by ED provider for discharge. Discharge instructions reviewed with patient/family using "Teach Back"; verbalized understanding of medication education and administration, and information about follow-up care. Denies further concerns.

## 2018-05-25 NOTE — ED Provider Notes (Signed)
[facility] Emergency Department Provider Note   First MD Initiated Contact with Patient 05/25/18 [phone]     (approximate)  I have reviewed the triage vital signs and the nursing notes.   HISTORY  Chief Complaint Medical Clearance   HPI Steven Gardner is a 58 y.o. male [place] Jansky to the emergency department from jail in police custody secondary concern for possible alcohol withdrawal.  Patient admits to shaking symptoms consistent with previous episodes of alcohol withdrawal patient is currently receiving Librium 50 mg twice daily while in jail.  However I am informed by the office today nurse that the jail was concerned that the patient still actively withdrawing.  Patient denies any previous history of seizures.        Past Medical History:  Diagnosis Date  . Hepatitis C   . Paresthesias   . Spinal stenosis   . Stroke [facility])     Patient Active Problem List   Diagnosis Date Noted  . Alcohol induced acute pancreatitis 11/29/2017  . Ileus (HCC) 11/29/2017  . Alcohol-induced pancreatitis 11/29/2017  . Acute gastritis without hemorrhage   . Hematemesis 11/07/2017  . Benign prostatic hyperplasia with urinary frequency 08/12/2017  . Type 2 diabetes mellitus (HCC) 08/03/2017  . Hx of hepatitis C 08/03/2017  . Chronic lower back pain 08/03/2017  . Cervical radiculopathy at C6 08/03/2017    Past Surgical History:  Procedure Laterality Date  . ELBOW SURGERY    . ESOPHAGOGASTRODUODENOSCOPY (EGD) WITH PROPOFOL N/A 11/08/2017   Procedure: ESOPHAGOGASTRODUODENOSCOPY (EGD) WITH PROPOFOL;  Surgeon: Midge Minium, MD;  Location[facility] ENDOSCOPY;  Service: Endoscopy;  Laterality: N/A;  . KNEE SURGERY    . NECK SURGERY    . SPINE SURGERY      Prior to Admission medications   Medication Sig Start Date End Date Taking? Authorizing Provider  Multiple Vitamin (MULTIVITAMIN WITH MINERALS) TABS tablet Take 1 tablet by mouth daily. 11/09/17   Salary, Evelena Asa, MD   pantoprazole (PROTONIX) 40 MG tablet Take 1 tablet (40 mg total) by mouth daily. 11/08/17   Salary, Evelena Asa, MD  ranitidine (ZANTAC) 150 MG tablet Take 1 tablet (150 mg total) by mouth 2 (two) times daily. 08/03/17   Kerman Passey, MD  tamsulosin (FLOMAX) 0.4 MG CAPS capsule Take 1 capsule (0.4 mg total) by mouth daily. 08/03/17   Kerman Passey, MD    Allergies Patient has no known allergies.  Family History  Problem Relation Age of Onset  . Stroke Paternal Grandfather     Social History Social History   Tobacco Use  . Smoking status: Former Games developer  . Smokeless tobacco: Never Used  Substance Use Topics  . Alcohol use: Yes    Comment: 8-10 40oz/beer daily  . Drug use: No    Review of Systems Constitutional: No fever/chills Eyes: No visual changes. ENT: No sore throat. Cardiovascular: Denies chest pain. Respiratory: Denies shortness of breath. Gastrointestinal: No abdominal pain.  No nausea, no vomiting.  No diarrhea.  No constipation. Genitourinary: Negative for dysuria. Musculoskeletal: Negative for neck pain.  Negative for back pain. Integumentary: Negative for rash. Neurological: Negative for headaches, focal weakness or numbness.  Positive for bilateral hand tremors.   ____________________________________________   PHYSICAL EXAM:  VITAL SIGNS: ED Triage Vitals  Enc Vitals Group     BP 05/25/18 0338 (!) 134/95     Pulse Rate 05/25/18 0338 (!) 102     Resp 05/25/18 0338 20     Temp 05/25/18  0338 98.3 F (36.8 C)     Temp Source 05/25/18 0338 Oral     SpO2 05/25/18 0338 100 %     Weight 05/25/18 0337 88.5 kg (195 lb)     Height 05/25/18 0337 1.803 m ( )     Head Circumference --      Peak Flow --      Pain Score 05/25/18 0334 0     Pain Loc --      Pain Edu? --      Excl. in GC? --     Constitutional: Alert and oriented. Well appearing and in no acute distress. Eyes: Conjunctivae are normal. PERRL. EOMI. Head: Atraumatic. Ears:  Healthy  appearing ear canals and TMs bilaterally Nose: No congestion/rhinnorhea. Mouth/Throat: Mucous membranes are moist.Oropharynx non-erythematous. Neck: No stridor.  Cardiovascular: Tachycardia, regular rhythm. Good peripheral circulation. Grossly normal heart sounds. Respiratory: Normal respiratory effort.  No retractions. No audible wheezing. Gastrointestinal: Soft and nontender. No distention.  Musculoskeletal: No lower extremity tenderness nor edema. No gross deformities of extremities. Neurologic:  Normal speech and language. No gross focal neurologic deficits are appreciated.  Skin:  Skin is warm, dry and intact. No rash noted. Psychiatric: Mood and affect are normal. Speech and behavior are normal.  ____________________________________________   LABS (all labs ordered are listed, but only abnormal results are displayed)  Labs Reviewed  CBC - Abnormal; Notable for the following components:      Result Value   Platelets 110 (*)    All other components within normal limits  COMPREHENSIVE METABOLIC PANEL - Abnormal; Notable for the following components:   Glucose, Bld 119 (*)    Total Protein 8.7 (*)    AST 106 (*)    ALT 74 (*)    Total Bilirubin 2.0 (*)    All other components within normal limits    __________ .Critical Care Performed by: Darci Current, MD Authorized by: Darci Current, MD   Critical care provider statement:    Critical care time (minutes):  30   Critical care time was exclusive of:  Separately billable procedures and treating other patients (Alcohol withdrawal)   Critical care was time spent personally by me on the following activities:  Development of treatment plan with patient or surrogate, discussions with consultants, evaluation of patient's response to treatment, examination of patient, obtaining history from patient or surrogate, ordering and performing treatments and interventions, ordering and review of laboratory studies, ordering and review  of radiographic studies, pulse oximetry, re-evaluation of patient's condition and review of old charts     ____________________________________________   INITIAL IMPRESSION / MDM / ASSESSMENT AND PLAN / ED COURSE  As part of my medical decision making, I reviewed the following data within the electronic MEDICAL RECORD NUMBER   58 year old male presenting with above-stated history and physical exam secondary concern for possible alcohol withdrawal.  Patient is tremulous with mild tachycardia and as such patient was given 2 mg of IV Ativan.  On reevaluation patient's heart rate now normal.  Patient admits to feeling "much better.  Patient discussed with RN at the jail who confirmed that the patient is prescribed a gram twice daily.  Patient discharged in custody of the police officer  ____________________________________________  FINAL CLINICAL IMPRESSION(S) / ED DIAGNOSES  Final diagnoses:  Alcohol withdrawal syndrome without complication (HCC)     MEDICATIONS GIVEN DURING THIS VISIT:  Medications - No data to display   ED Discharge Orders    None  Note:  This document was prepared using Dragon voice recognition software and may include unintentional dictation errors.   Darci Current, MD 05/25/18 [phone]

## 2018-05-25 NOTE — ED Notes (Signed)
Pt calm and cooperative states feels more relaxed. Contacted Jail nurse [place]) who states he is currently getting librium at jail and they are able to administer meds without difficulty.

## 2018-08-11 ENCOUNTER — Ambulatory Visit: Payer: Medicaid Other | Admitting: Ophthalmology

## 2018-11-01 ENCOUNTER — Emergency Department: Payer: Medicaid Other

## 2018-11-01 ENCOUNTER — Emergency Department
Admission: EM | Admit: 2018-11-01 | Discharge: 2018-11-01 | Disposition: A | Discharge status: Home / Self Care | Payer: Medicaid Other | Attending: Emergency Medicine | Admitting: Emergency Medicine

## 2018-11-01 ENCOUNTER — Encounter: Payer: Self-pay | Admitting: Emergency Medicine

## 2018-11-01 ENCOUNTER — Other Ambulatory Visit: Payer: Self-pay

## 2018-11-01 DIAGNOSIS — E119 Type 2 diabetes mellitus without complications: Secondary | ICD-10-CM | POA: Diagnosis not present

## 2018-11-01 DIAGNOSIS — S01312A Laceration without foreign body of left ear, initial encounter: Secondary | ICD-10-CM | POA: Diagnosis not present

## 2018-11-01 DIAGNOSIS — Y999 Unspecified external cause status: Secondary | ICD-10-CM | POA: Insufficient documentation

## 2018-11-01 DIAGNOSIS — Y939 Activity, unspecified: Secondary | ICD-10-CM | POA: Insufficient documentation

## 2018-11-01 DIAGNOSIS — W19XXXA Unspecified fall, initial encounter: Secondary | ICD-10-CM

## 2018-11-01 DIAGNOSIS — Y9289 Other specified places as the place of occurrence of the external cause: Secondary | ICD-10-CM | POA: Insufficient documentation

## 2018-11-01 DIAGNOSIS — Y908 Blood alcohol level of 240 mg/100 ml or more: Secondary | ICD-10-CM | POA: Diagnosis not present

## 2018-11-01 DIAGNOSIS — W108XXA Fall (on) (from) other stairs and steps, initial encounter: Secondary | ICD-10-CM | POA: Diagnosis not present

## 2018-11-01 DIAGNOSIS — F10929 Alcohol use, unspecified with intoxication, unspecified: Secondary | ICD-10-CM

## 2018-11-01 DIAGNOSIS — S20412A Abrasion of left back wall of thorax, initial encounter: Secondary | ICD-10-CM | POA: Diagnosis not present

## 2018-11-01 DIAGNOSIS — Z87891 Personal history of nicotine dependence: Secondary | ICD-10-CM | POA: Insufficient documentation

## 2018-11-01 DIAGNOSIS — F10120 Alcohol abuse with intoxication, uncomplicated: Secondary | ICD-10-CM | POA: Insufficient documentation

## 2018-11-01 DIAGNOSIS — S0990XA Unspecified injury of head, initial encounter: Secondary | ICD-10-CM | POA: Diagnosis present

## 2018-11-01 LAB — COMPREHENSIVE METABOLIC PANEL
ALT: 32 U/L (ref 0–44)
AST: 37 U/L (ref 15–41)
Albumin: 3.9 g/dL (ref 3.5–5.0)
Alkaline Phosphatase: 55 U/L (ref 38–126)
Anion gap: 7 (ref 5–15)
BUN: 12 mg/dL (ref 6–20)
CO2: 25 mmol/L (ref 22–32)
Calcium: 8.2 mg/dL — ABNORMAL LOW (ref 8.9–10.3)
Chloride: 108 mmol/L (ref 98–111)
Creatinine, Ser: 0.87 mg/dL (ref 0.61–1.24)
GFR calc Af Amer: >60 mL/min (ref >60)
GFR calc non Af Amer: >60 mL/min (ref >60)
Glucose, Bld: 113 mg/dL — ABNORMAL HIGH (ref 70–99)
Potassium: 4 mmol/L (ref 3.5–5.1)
Sodium: 140 mmol/L (ref 135–145)
Total Bilirubin: 0.4 mg/dL (ref 0.3–1.2)
Total Protein: 7.2 g/dL (ref 6.5–8.1)

## 2018-11-01 LAB — CBC
HCT: 39.7 % (ref 39.0–52.0)
Hemoglobin: 13.9 g/dL (ref 13.0–17.0)
MCH: 30.9 pg (ref 26.0–34.0)
MCHC: 35 g/dL (ref 30.0–36.0)
MCV: 88.2 fL (ref 80.0–100.0)
Platelets: 157 10*3/uL (ref 150–400)
RBC: 4.5 MIL/uL (ref 4.22–5.81)
RDW: 13 % (ref 11.5–15.5)
WBC: 8 10*3/uL (ref 4.0–10.5)
nRBC: 0 % (ref 0.0–0.2)

## 2018-11-01 LAB — ETHANOL: Alcohol, Ethyl (B): 411 mg/dL (ref <10)

## 2018-11-01 MED ORDER — LIDOCAINE HCL (PF) 1 % IJ SOLN
10.0000 mL | Freq: Once | INTRAMUSCULAR | Status: AC
  Administered 2018-11-01: 10 mL
  Filled 2018-11-01: qty 10

## 2018-11-01 NOTE — ED Provider Notes (Signed)
[facility] Emergency Department Provider Note  ____________________________________________  Time seen: Approximately 2:14 AM  I have reviewed the triage vital signs and the nursing notes.   HISTORY  Chief Complaint Laceration  Level 5 caveat:  Portions of the history and physical were unable to be obtained due to intoxication   HPI Steven Gardner is a 58 y.o. male history of alcohol abuse, diabetes, hepatitis C who presents for evaluation of trauma.  Patient is extremely intoxicated. Larey SeatFell off stairs and found by a friend at the bottom of the stairs. Unknown LOC. Not on blood thinners. Patient is an alcoholic and drinks everyday. Denis other drug use. Patient has a laceration of the L helix involving cartilage. He denies HA, neck pain, back pain, extremity pain or CP  Past Medical History:  Diagnosis Date   Hepatitis C    Paresthesias    Spinal stenosis    Stroke [facility](HCC)     Patient Active Problem List   Diagnosis Date Noted   Alcohol induced acute pancreatitis 11/29/2017   Ileus (HCC) 11/29/2017   Alcohol-induced pancreatitis 11/29/2017   Acute gastritis without hemorrhage    Hematemesis 11/07/2017   Benign prostatic hyperplasia with urinary frequency 08/12/2017   Type 2 diabetes mellitus (HCC) 08/03/2017   Hx of hepatitis C 08/03/2017   Chronic lower back pain 08/03/2017   Cervical radiculopathy at C6 08/03/2017    Past Surgical History:  Procedure Laterality Date   ELBOW SURGERY     ESOPHAGOGASTRODUODENOSCOPY (EGD) WITH PROPOFOL N/A 11/08/2017   Procedure: ESOPHAGOGASTRODUODENOSCOPY (EGD) WITH PROPOFOL;  Surgeon: Midge MiniumWohl, Darren, MD;  Location: [facility] ENDOSCOPY;  Service: Endoscopy;  Laterality: N/A;   KNEE SURGERY     NECK SURGERY     SPINE SURGERY      Prior to Admission medications   Medication Sig Start Date End Date Taking? Authorizing Provider  Multiple Vitamin (MULTIVITAMIN WITH MINERALS) TABS tablet Take 1  tablet by mouth daily. 11/09/17   Salary, Evelena AsaMontell D, MD  pantoprazole (PROTONIX) 40 MG tablet Take 1 tablet (40 mg total) by mouth daily. 11/08/17   Salary, Evelena AsaMontell D, MD  ranitidine (ZANTAC) 150 MG tablet Take 1 tablet (150 mg total) by mouth 2 (two) times daily. 08/03/17   Kerman PasseyLada, Melinda P, MD  tamsulosin (FLOMAX) 0.4 MG CAPS capsule Take 1 capsule (0.4 mg total) by mouth daily. 08/03/17   Kerman PasseyLada, Melinda P, MD    Allergies Patient has no known allergies.  Family History  Problem Relation Age of Onset   Stroke Paternal Grandfather     Social History Social History   Tobacco Use   Smoking status: Former Smoker   Smokeless tobacco: Never Used  Substance Use Topics   Alcohol use: Yes    Comment: 8-10 40oz/beer daily   Drug use: No    Review of Systems  Constitutional: Negative for fever. Eyes: Negative for visual changes. ENT: Negative for facial injury or neck injury Cardiovascular: Negative for chest injury. Respiratory: Negative for shortness of breath. Negative for chest wall injury. Gastrointestinal: Negative for abdominal pain or injury. Genitourinary: Negative for dysuria. Musculoskeletal: Negative for back injury, negative for arm or leg pain. Skin: Negative for laceration. + chest wall abrasion Neurological: + head injury.   ____________________________________________   PHYSICAL EXAM:  VITAL SIGNS: ED Triage Vitals  Enc Vitals Group     BP 11/01/18 0133 (!) 157/89     Pulse Rate 11/01/18 0133 89     Resp 11/01/18 0133 20  Temp 11/01/18 0133 98.3 F (36.8 C)     Temp Source 11/01/18 0133 Oral     SpO2 11/01/18 0133 99 %     Weight 11/01/18 0138 195 lb 1.7 oz (88.5 kg)     Height 11/01/18 0138 5\' 11"  (1.803 m)     Head Circumference --      Peak Flow --      Pain Score 11/01/18 0138 10     Pain Loc --      Pain Edu? --      Excl. in [place]? --     Full spinal precautions maintained throughout the trauma exam. Constitutional: Alert and oriented,  extremely intoxicated, no distress. HEENT Head: Normocephalic and atraumatic. Face: No facial bony tenderness. Stable midface Ears: No hemotympanum bilaterally. No Battle sign. There is a laceration through the helix of the L ear including the cartilage Eyes: No eye injury. PERRL. No raccoon eyes Nose: Nontender. No epistaxis. No rhinorrhea Mouth/Throat: Mucous membranes are moist. No oropharyngeal blood. No dental injury. Airway patent without stridor. Normal voice. Neck: no C-collar. No midline c-spine tenderness.  Cardiovascular: Normal rate, regular rhythm. Normal and symmetric distal pulses are present in all extremities. Pulmonary/Chest: Chest wall is stable and nontender to palpation/compression. Normal respiratory effort. Breath sounds are normal. No crepitus.  Abdominal: Soft, nontender, non distended. Musculoskeletal: Nontender with normal full range of motion in all extremities. No deformities. No thoracic or lumbar midline spinal tenderness. Pelvis is stable. Skin: Skin is warm, dry and intact. Abrasion to the L upper back Psychiatric: Slightly combative Neurological: Slurred speech. Moves all extremities to command. No gross focal neurologic deficits are appreciated.  Glascow Coma Score: 4 - Opens eyes on own 6 - Follows simple motor commands 4 - Seems confused, disoriented GCS: 14   ____________________________________________   LABS (all labs ordered are listed, but only abnormal results are displayed)  Labs Reviewed  COMPREHENSIVE METABOLIC PANEL - Abnormal; Notable for the following components:      Result Value   Glucose, Bld 113 (*)    Calcium 8.2 (*)    All other components within normal limits  ETHANOL - Abnormal; Notable for the following components:   Alcohol, Ethyl (B) 411 (*)    All other components within normal limits  CBC   ____________________________________________  EKG  ED ECG REPORT I, Rudene Re, the attending physician, personally  viewed and interpreted this ECG.  Normal sinus rhythm, rate of 83, normal intervals, borderline right axis deviation, no ST elevations or depressions.  Unchanged from prior. ____________________________________________  RADIOLOGY  I have personally reviewed the images performed during this visit and I agree with the Radiologist's read.   Interpretation by Radiologist:  Dg Ribs Unilateral W/chest Left  Result Date: 11/01/2018 CLINICAL DATA:  Fall with bruising to the left chest wall EXAM: LEFT RIBS AND CHEST - 3+ VIEW COMPARISON:  08/08/2016 FINDINGS: Single-view chest demonstrates surgical hardware in the cervical spine. No focal opacity or pleural effusion. Normal heart size. No pneumothorax. Left rib series demonstrates no acute displaced left rib fracture. IMPRESSION: 1. Negative for pneumothorax or pleural effusion. 2. No acute displaced left rib fracture Electronically Signed   By: Donavan Foil M.D.   On: 11/01/2018 02:36   Ct Head Wo Contrast  Result Date: 11/01/2018 CLINICAL DATA:  Head trauma fell down stairs EXAM: CT HEAD WITHOUT CONTRAST TECHNIQUE: Contiguous axial images were obtained from the base of the skull through the vertex without intravenous contrast. COMPARISON:  05/29/2016 CT  FINDINGS: Brain: No acute territorial infarction, hemorrhage, or intracranial mass. Interval finding of left greater than right inferior frontal lobe encephalomalacia and left greater than right temporal lobe encephalomalacia. Encephalomalacia within the right basal ganglia. Mild atrophy. Mild small vessel ischemic changes of the white matter. Vascular: No hyperdense vessels. Scattered calcifications at the carotid siphon Skull: No fracture Sinuses/Orbits: No acute finding. Other: Left posterior auriculectomy and temporoparietal soft tissue swelling. IMPRESSION: 1. No CT evidence for acute intracranial abnormality. 2. Interval finding of encephalomalacia within the inferior frontal lobes bilaterally as  well as the anterior temporal lobes suggesting suggesting sequela of prior trauma. Electronically Signed   By: Jasmine Pang M.D.   On: 11/01/2018 02:45   Ct Cervical Spine Wo Contrast  Result Date: 11/01/2018 CLINICAL DATA:  Larey Seat down stairs EXAM: CT CERVICAL SPINE WITHOUT CONTRAST TECHNIQUE: Multidetector CT imaging of the cervical spine was performed without intravenous contrast. Multiplanar CT image reconstructions were also generated. COMPARISON:  CT 05/29/2016 FINDINGS: Alignment: Straightening of the cervical spine. Facet alignment within normal limits Skull base and vertebrae: No acute fracture. No primary bone lesion or focal pathologic process. Soft tissues and spinal canal: No prevertebral fluid or swelling. No visible canal hematoma. Disc levels: Post fusion changes C5 through C7 with graft material at C6. Bulky anterior osteophytes at C3-C4, C4-C5 and C7-T1. Upper chest: Negative. Other: None IMPRESSION: 1. No CT evidence for acute osseous abnormality of the cervical spine 2. Post fusion changes C5 through C7. Degenerative changes elsewhere in the cervical spine. Electronically Signed   By: Jasmine Pang M.D.   On: 11/01/2018 02:52     ____________________________________________   PROCEDURES  Procedure(s) performed:yes  .Marland KitchenLaceration Repair  Date/Time: 11/01/2018 5:49 AM Performed by: Nita Sickle, MD Authorized by: Nita Sickle, MD   Consent:    Consent obtained:  Verbal   Consent given by:  Patient   Risks discussed:  Infection, pain, retained foreign body, poor cosmetic result, poor wound healing and vascular damage (cartilage death) Anesthesia (see MAR for exact dosages):    Anesthesia method:  Local infiltration and nerve block   Local anesthetic:  Lidocaine 1% w/o epi   Block needle gauge:  25 G   Block anesthetic:  Lidocaine 1% w/o epi   Block injection procedure:  Anatomic landmarks identified, negative aspiration for blood and incremental injection    Block outcome:  Anesthesia achieved Laceration details:    Location:  Ear   Ear location:  L ear   Length (cm):  5 Repair type:    Repair type:  Complex Pre-procedure details:    Preparation:  Patient was prepped and draped in usual sterile fashion and imaging obtained to evaluate for foreign bodies Exploration:    Hemostasis achieved with:  Direct pressure   Wound exploration: entire depth of wound probed and visualized     Wound extent: no foreign bodies/material noted, no underlying fracture noted and no vascular damage noted     Contaminated: no   Treatment:    Area cleansed with:  Saline and Betadine   Amount of cleaning:  Extensive   Irrigation solution:  Sterile saline   Visualized foreign bodies/material removed: no     Debridement:  None Skin repair:    Repair method:  Sutures   Suture size:  5-0   Suture material:  Nylon   Suture technique:  Simple interrupted   Number of sutures:  12 Approximation:    Approximation:  Close Post-procedure details:    Dressing:  Sterile  dressing   Patient tolerance of procedure:  Tolerated well, no immediate complications   Critical Care performed:  None ____________________________________________   INITIAL IMPRESSION / ASSESSMENT AND PLAN / ED COURSE   58 y.o. male history of alcohol abuse, diabetes, hepatitis C who presents for evaluation of trauma.  Patient fell down a flight of stairs while intoxicated.  Found by a friend at the bottom of the stairs conscious.  Patient is extremely intoxicated but otherwise neurologically intact.  Has a laceration of the left helix involving cartilage and abrasions to the left upper back.  No other injuries.  CT head and cervical spine with no acute injuries.  Chest x-ray with no rib fractures.  Patient is slightly combative therefore will hold off on repairing the laceration until patient is sober and cooperative.  His tetanus is up-to-date per review of epic.  Labs showing no significant  electrolyte abnormalities, AKI, or metabolic acidosis.    _________________________ 5:51 AM on 11/01/2018 -----------------------------------------  Patient cooperative and tolerated repair of his ear laceration with no complications. Discussed wound care with patient and his girlfriend, removal of stitches in 7-10 days, and my standard return precautions.     As part of my medical decision making, I reviewed the following data within the electronic MEDICAL RECORD NUMBER History obtained from family, Nursing notes reviewed and incorporated, Labs reviewed , EKG interpreted , Old EKG reviewed, Old chart reviewed, Radiograph reviewed , Notes from prior ED visits and [place] Controlled Substance Database   Patient was evaluated in Emergency Department today for the symptoms described in the history of present illness. Patient was evaluated in the context of the global COVID-19 pandemic, which necessitated consideration that the patient might be at risk for infection with the SARS-CoV-2 virus that causes COVID-19. Institutional protocols and algorithms that pertain to the evaluation of patients at risk for COVID-19 are in a state of rapid change based on information released by regulatory bodies including the CDC and federal and state organizations. These policies and algorithms were followed during the patient's care in the ED.   ____________________________________________   FINAL CLINICAL IMPRESSION(S) / ED DIAGNOSES   Final diagnoses:  Fall, initial encounter  Alcoholic intoxication with complication (HCC)  Abrasion of left side of back, initial encounter  Complex laceration of left ear, initial encounter      NEW MEDICATIONS STARTED DURING THIS VISIT:  ED Discharge Orders    None       Note:  This document was prepared using Dragon voice recognition software and may include unintentional dictation errors.    Nita Sickle, MD 11/01/18 [phone]

## 2018-11-01 NOTE — ED Notes (Signed)
Pt up walking in hallway with blood dripping into floor. Pt escorted back to his room. Pt cussing and sts, "Im ready to leave. Im in pain. I almost had my whole ear cut off." In room pts sutured left ear, redressed and pt educated on the importance of staying in the room and waiting for MD to discharge when appropriate.

## 2018-11-01 NOTE — ED Triage Notes (Signed)
Alerted to staff by family member that pt was lying in bushes outside entrance; pt assisted into w/c and brought to room 12 for further evaluation; wife reports she came home from work and found pt had fallen down steps

## 2018-11-01 NOTE — ED Notes (Signed)
ED Provider at bedside. 

## 2018-11-01 NOTE — Discharge Instructions (Addendum)
Keep laceration dry and clean. Wash with warm water and soap. Apply topical bacitracin. Protect from the sun to minimize scarring. Cover it with SPF 77 or higher and use hat when out in the sun for 6-9 months. You received 12 stitches that must be removed in 7-10 days.  Watch for signs of infection: pus, redness of the skin surrounding it, or fever. If these develop see your doctor or return to the ER for antibiotics.

## 2018-11-01 NOTE — ED Notes (Signed)
Pt states he fell down the stairs. Wife came home to find him. Pt is not cooperative and is being rude and insulting to his wife

## 2019-01-31 ENCOUNTER — Emergency Department: Payer: Medicaid Other

## 2019-01-31 ENCOUNTER — Other Ambulatory Visit: Payer: Self-pay

## 2019-01-31 ENCOUNTER — Emergency Department
Admission: EM | Admit: 2019-01-31 | Discharge: 2019-02-01 | Disposition: A | Discharge status: Home / Self Care | Payer: Medicaid Other | Attending: Student | Admitting: Student

## 2019-01-31 DIAGNOSIS — Z8619 Personal history of other infectious and parasitic diseases: Secondary | ICD-10-CM | POA: Diagnosis present

## 2019-01-31 DIAGNOSIS — Y998 Other external cause status: Secondary | ICD-10-CM | POA: Diagnosis not present

## 2019-01-31 DIAGNOSIS — Y929 Unspecified place or not applicable: Secondary | ICD-10-CM | POA: Diagnosis not present

## 2019-01-31 DIAGNOSIS — W01198A Fall on same level from slipping, tripping and stumbling with subsequent striking against other object, initial encounter: Secondary | ICD-10-CM | POA: Diagnosis not present

## 2019-01-31 DIAGNOSIS — G8929 Other chronic pain: Secondary | ICD-10-CM | POA: Diagnosis present

## 2019-01-31 DIAGNOSIS — Z79899 Other long term (current) drug therapy: Secondary | ICD-10-CM | POA: Diagnosis not present

## 2019-01-31 DIAGNOSIS — S0101XA Laceration without foreign body of scalp, initial encounter: Secondary | ICD-10-CM | POA: Insufficient documentation

## 2019-01-31 DIAGNOSIS — M5412 Radiculopathy, cervical region: Secondary | ICD-10-CM | POA: Diagnosis present

## 2019-01-31 DIAGNOSIS — F1010 Alcohol abuse, uncomplicated: Secondary | ICD-10-CM | POA: Diagnosis not present

## 2019-01-31 DIAGNOSIS — S0990XA Unspecified injury of head, initial encounter: Secondary | ICD-10-CM | POA: Diagnosis present

## 2019-01-31 DIAGNOSIS — R350 Frequency of micturition: Secondary | ICD-10-CM | POA: Diagnosis present

## 2019-01-31 DIAGNOSIS — Z8673 Personal history of transient ischemic attack (TIA), and cerebral infarction without residual deficits: Secondary | ICD-10-CM | POA: Insufficient documentation

## 2019-01-31 DIAGNOSIS — R4689 Other symptoms and signs involving appearance and behavior: Secondary | ICD-10-CM

## 2019-01-31 DIAGNOSIS — Y9389 Activity, other specified: Secondary | ICD-10-CM | POA: Insufficient documentation

## 2019-01-31 DIAGNOSIS — K852 Alcohol induced acute pancreatitis without necrosis or infection: Secondary | ICD-10-CM | POA: Diagnosis not present

## 2019-01-31 DIAGNOSIS — Z87891 Personal history of nicotine dependence: Secondary | ICD-10-CM | POA: Diagnosis not present

## 2019-01-31 DIAGNOSIS — K920 Hematemesis: Secondary | ICD-10-CM | POA: Diagnosis present

## 2019-01-31 DIAGNOSIS — E119 Type 2 diabetes mellitus without complications: Secondary | ICD-10-CM | POA: Diagnosis not present

## 2019-01-31 DIAGNOSIS — K567 Ileus, unspecified: Secondary | ICD-10-CM | POA: Diagnosis present

## 2019-01-31 DIAGNOSIS — N401 Enlarged prostate with lower urinary tract symptoms: Secondary | ICD-10-CM | POA: Diagnosis present

## 2019-01-31 DIAGNOSIS — K2900 Acute gastritis without bleeding: Secondary | ICD-10-CM | POA: Diagnosis present

## 2019-01-31 LAB — CBC
HCT: 39.9 % (ref 39.0–52.0)
Hemoglobin: 13.8 g/dL (ref 13.0–17.0)
MCH: 33.3 pg (ref 26.0–34.0)
MCHC: 34.6 g/dL (ref 30.0–36.0)
MCV: 96.4 fL (ref 80.0–100.0)
Platelets: 121 10*3/uL — ABNORMAL LOW (ref 150–400)
RBC: 4.14 MIL/uL — ABNORMAL LOW (ref 4.22–5.81)
RDW: 12.3 % (ref 11.5–15.5)
WBC: 6.1 10*3/uL (ref 4.0–10.5)
nRBC: 0 % (ref 0.0–0.2)

## 2019-01-31 LAB — COMPREHENSIVE METABOLIC PANEL
ALT: 57 U/L — ABNORMAL HIGH (ref 0–44)
AST: 101 U/L — ABNORMAL HIGH (ref 15–41)
Albumin: 4 g/dL (ref 3.5–5.0)
Alkaline Phosphatase: 57 U/L (ref 38–126)
Anion gap: 11 (ref 5–15)
BUN: 12 mg/dL (ref 6–20)
CO2: 24 mmol/L (ref 22–32)
Calcium: 8.4 mg/dL — ABNORMAL LOW (ref 8.9–10.3)
Chloride: 108 mmol/L (ref 98–111)
Creatinine, Ser: 1.13 mg/dL (ref 0.61–1.24)
GFR calc Af Amer: >60 mL/min (ref >60)
GFR calc non Af Amer: >60 mL/min (ref >60)
Glucose, Bld: 177 mg/dL — ABNORMAL HIGH (ref 70–99)
Potassium: 3.8 mmol/L (ref 3.5–5.1)
Sodium: 143 mmol/L (ref 135–145)
Total Bilirubin: 0.5 mg/dL (ref 0.3–1.2)
Total Protein: 7.2 g/dL (ref 6.5–8.1)

## 2019-01-31 LAB — ACETAMINOPHEN LEVEL: Acetaminophen (Tylenol), Serum: <10 ug/mL — ABNORMAL LOW (ref 10–30)

## 2019-01-31 LAB — ETHANOL: Alcohol, Ethyl (B): 342 mg/dL (ref <10)

## 2019-01-31 LAB — SALICYLATE LEVEL: Salicylate Lvl: <7 mg/dL — ABNORMAL LOW (ref 7.0–30.0)

## 2019-01-31 MED ORDER — ACETAMINOPHEN 325 MG PO TABS
650.0000 mg | ORAL_TABLET | Freq: Once | ORAL | Status: AC
  Administered 2019-01-31: 21:00:00 650 mg via ORAL
  Filled 2019-01-31: qty 2

## 2019-01-31 MED ORDER — DROPERIDOL 2.5 MG/ML IJ SOLN
2.5000 mg | Freq: Once | INTRAMUSCULAR | Status: AC
  Administered 2019-01-31: 17:00:00 2.5 mg via INTRAMUSCULAR

## 2019-01-31 NOTE — ED Notes (Signed)
Hourly rounding reveals patient in room. No complaints, stable, in no acute distress. Q15 minute rounds and monitoring via Rover and Officer to continue.   

## 2019-01-31 NOTE — ED Notes (Signed)
As per police officer whom brought patient to ed. Patient ETOH was having and argument with girlfriend, officer at scene apprehended patient, patient lost balance and hit the pavement head first.  Present aox4, denies loc, posotive lac noted to right side of head. Lac pressure bandage applied by ems prior to ed arrival.  

## 2019-01-31 NOTE — ED Notes (Signed)
DR. Mort Sawyers at bedside to sutures patients lac.

## 2019-01-31 NOTE — ED Provider Notes (Addendum)
[facility] Emergency Department Provider Note  ____________________________________________   First MD Initiated Contact with Patient 01/31/19 1654     (approximate)  I have reviewed the triage vital signs and the nursing notes.  History  Chief Complaint Laceration and Alcohol Intoxication    HPI BRYAN GOIN is a 59 y.o. male with hx of alcohol abuse, hepatitis C, who presents for alcohol intoxication, fall with head laceration, and aggressive behavior.   Patient was initially brought to [facility] by his partner, and was reportedly being aggressive and making threatening statements.  Clearly intoxicated.  Proceeded to make similar statements to police. At some point sustained a fall with head injury, therefore brought to the ED for further evaluation. Has a right forehead/scalp hematoma and associated laceration. Patient does not recall how the fall occurred.   History limited due to patient's intoxication.   Past Medical Hx Past Medical History:  Diagnosis Date  . Hepatitis C   . Paresthesias   . Spinal stenosis   . Stroke [facility])     Problem List Patient Active Problem List   Diagnosis Date Noted  . Alcohol induced acute pancreatitis 11/29/2017  . Ileus (HCC) 11/29/2017  . Alcohol-induced pancreatitis 11/29/2017  . Acute gastritis without hemorrhage   . Hematemesis 11/07/2017  . Benign prostatic hyperplasia with urinary frequency 08/12/2017  . Type 2 diabetes mellitus (HCC) 08/03/2017  . Hx of hepatitis C 08/03/2017  . Chronic lower back pain 08/03/2017  . Cervical radiculopathy at C6 08/03/2017    Past Surgical Hx Past Surgical History:  Procedure Laterality Date  . ELBOW SURGERY    . ESOPHAGOGASTRODUODENOSCOPY (EGD) WITH PROPOFOL N/A 11/08/2017   Procedure: ESOPHAGOGASTRODUODENOSCOPY (EGD) WITH PROPOFOL;  Surgeon: Midge Minium, MD;  Location: [facility] ENDOSCOPY;  Service: Endoscopy;  Laterality: N/A;  . KNEE SURGERY    . NECK SURGERY     . SPINE SURGERY      Medications Prior to Admission medications   Medication Sig Start Date End Date Taking? Authorizing Provider  Multiple Vitamin (MULTIVITAMIN WITH MINERALS) TABS tablet Take 1 tablet by mouth daily. 11/09/17   Salary, Evelena Asa, MD  pantoprazole (PROTONIX) 40 MG tablet Take 1 tablet (40 mg total) by mouth daily. 11/08/17   Salary, Evelena Asa, MD  ranitidine (ZANTAC) 150 MG tablet Take 1 tablet (150 mg total) by mouth 2 (two) times daily. 08/03/17   Kerman Passey, MD  tamsulosin (FLOMAX) 0.4 MG CAPS capsule Take 1 capsule (0.4 mg total) by mouth daily. 08/03/17   Kerman Passey, MD    Allergies Patient has no known allergies.  Family Hx Family History  Problem Relation Age of Onset  . Stroke Paternal Grandfather     Social Hx Social History   Tobacco Use  . Smoking status: Former Games developer  . Smokeless tobacco: Never Used  Substance Use Topics  . Alcohol use: Yes    Comment: 8-10 40oz/beer daily  . Drug use: No     Review of Systems  Constitutional: Negative for fever, chills. Eyes: Negative for visual changes. ENT: Negative for sore throat. Cardiovascular: Negative for chest pain. Respiratory: Negative for shortness of breath. Gastrointestinal: Negative for nausea, vomiting.  Genitourinary: Negative for dysuria. Musculoskeletal: Negative for leg swelling. Skin: + hematoma and fall Neurological: Negative for for headaches.   Physical Exam  Vital Signs: ED Triage Vitals  Enc Vitals Group     BP 01/31/19 1649 (!) 146/84     Pulse Rate 01/31/19 1649 (!) 107  Resp 01/31/19 1649 17     Temp 01/31/19 1649 98 F (36.7 C)     Temp Source 01/31/19 1813 Oral     SpO2 01/31/19 1649 98 %     Weight 01/31/19 1650 198 lb (89.8 kg)     Height 01/31/19 1650 6\' 2"  (1.88 m)     Head Circumference --      Peak Flow --      Pain Score 01/31/19 1650 0     Pain Loc --      Pain Edu? --      Excl. in [place]? --      Constitutional: Alert and oriented.   Intoxicated. Head: Large right frontal scalp/forehead hematoma, boggy.  Associated with a 2 cm, irregular laceration.  Small, superficial pulsating, arterial bleeding vessel.  Hemostasis achieved as below. Eyes: Conjunctivae clear. Sclera anicteric. Nose: No congestion. No rhinorrhea. Mouth/Throat: Mucous membranes are moist.  Neck: No stridor.   Cardiovascular: Normal rate. Extremities well perfused. Respiratory: Normal respiratory effort.   Gastrointestinal: Non-distended.  Musculoskeletal: No deformities. Neurologic:  Normal speech and language in setting of intoxication. No gross focal neurologic deficits are appreciated.  Skin: Skin is warm, dry and intact.  Psychiatric: Intoxicated.  EKG  N/A    Radiology  CT head: IMPRESSION:  1. Large right frontal scalp hematoma. No displaced skull fracture  or signs of significant acute intracranial trauma.  2. Areas of encephalomalacia in the frontal lobes bilaterally and in  the anterior temporal lobes bilaterally, compatible with  encephalomalacia/gliosis, likely related to remote trauma.  3. Mild cerebral atrophy with mild chronic microvascular ischemic  changes in the cerebral white matter.    Procedures  Procedure(s) performed (including critical care):  03/05/21Marland KitchenLaceration Repair  Date/Time: 01/31/2019 9:41 PM Performed by: 03/31/2019., MD Authorized by: Miguel Aschoff., MD   Consent:    Consent obtained:  Verbal   Consent given by:  Patient   Risks discussed:  Infection, need for additional repair, poor wound healing, poor cosmetic result and pain Anesthesia (see MAR for exact dosages):    Anesthesia method:  Local infiltration   Local anesthetic:  Lidocaine 2% WITH epi Laceration details:    Location:  Face   Facial location: R forehead area.   Length (cm):  2 Repair type:    Repair type:  Intermediate Pre-procedure details:    Preparation:  Patient was prepped and draped in usual sterile fashion Exploration:     Hemostasis achieved with:  Direct pressure and epinephrine (small superifical arterial bleeding vessel identified, figure 8 suture applied with good hemostasis) Skin repair:    Repair method:  Sutures   Suture size:  3-0   Wound skin closure material used: ethilon.   Number of sutures: 1 x figure of 8, 1 x simple interrupted. Approximation:    Approximation:  Close Post-procedure details:    Dressing:  Open (no dressing)   Patient tolerance of procedure:  Tolerated well, no immediate complications Comments:     Hemostasis achieved, see below     Initial Impression / Assessment and Plan / ED Course  59 y.o. male who presents to the ED for intoxication, forehead/scalp laceration, and aggressive behavior. As above.  Laceration repaired as above. Had a large, boggy hematoma to the R forehead and scalp area with a small superficial arterial bleeding vessel. This was sutured with 1 figure of 8 and 1 simple interrupted with good hemostasis. Remainder of scalp evaluated with no other lacerations identified. Tetanus  up to date on chart review.  Per PD, patient was initially brought to [place] by his partner, as he was making aggressive/threatening statements to her, and then made similar statements to police as well. Suspect his intoxication is a large component, however given this, will place under IVC.  Basic labs w/o actionable derangements aside from elevated alcohol level, consistent w/ his presentation. CT head reveals large right frontal scalp hematoma, no skull fracture or signs of intracranial trauma.  Psychiatry has seen and evaluated, does not meet inpatient criteria at this time.  As such, will plan to observe until clinically sober and then anticipate discharge.   Final Clinical Impression(s) / ED Diagnosis  Final diagnoses:  Alcohol abuse  Laceration of scalp, initial encounter  Aggressive behavior       Note:  This document was prepared using Dragon voice recognition  software and may include unintentional dictation errors.   Lilia Pro., MD 01/31/19 Linward Foster    Lilia Pro., MD 02/08/19 [phone]

## 2019-01-31 NOTE — ED Notes (Addendum)
Per EDP order this writer cleaned the laceration site with gauge and NS and wrap it. No issues.

## 2019-01-31 NOTE — ED Notes (Signed)
Patients head sutured, Safety maintained.

## 2019-01-31 NOTE — ED Triage Notes (Signed)
As per police officer whom brought patient to ed. Patient ETOH was having and argument with girlfriend, officer at scene apprehended patient, patient lost balance and hit the pavement head first.  Present aox4, denies loc, posotive lac noted to right side of head. Lac pressure bandage applied by ems prior to ed arrival.

## 2019-01-31 NOTE — ED Notes (Signed)
Report to include Situation, Background, Assessment, and Recommendations received from Jeannette RN. Patient alert and oriented, warm and dry, in no acute distress. Patient denies SI, HI, AVH and pain. Patient made aware of Q15 minute rounds and Rover and Officer presence for their safety. Patient instructed to come to me with needs or concerns.   

## 2019-01-31 NOTE — ED Notes (Signed)
Patient off unit to ct head.  

## 2019-02-01 DIAGNOSIS — F1010 Alcohol abuse, uncomplicated: Secondary | ICD-10-CM

## 2019-02-01 LAB — URINE DRUG SCREEN, QUALITATIVE (ARMC ONLY)
Amphetamines, Ur Screen: NOT DETECTED
Barbiturates, Ur Screen: NOT DETECTED
Benzodiazepine, Ur Scrn: NOT DETECTED
Cannabinoid 50 Ng, Ur ~~LOC~~: NOT DETECTED
Cocaine Metabolite,Ur ~~LOC~~: NOT DETECTED
MDMA (Ecstasy)Ur Screen: NOT DETECTED
Methadone Scn, Ur: NOT DETECTED
Opiate, Ur Screen: NOT DETECTED
Phencyclidine (PCP) Ur S: NOT DETECTED
Tricyclic, Ur Screen: NOT DETECTED

## 2019-02-01 MED ORDER — LORAZEPAM 2 MG PO TABS
0.0000 mg | ORAL_TABLET | Freq: Four times a day (QID) | ORAL | Status: DC
  Administered 2019-02-01: 04:00:00 2 mg via ORAL
  Filled 2019-02-01: qty 1

## 2019-02-01 MED ORDER — ACETAMINOPHEN 500 MG PO TABS
1000.0000 mg | ORAL_TABLET | Freq: Once | ORAL | Status: AC
  Administered 2019-02-01: 08:00:00 1000 mg via ORAL
  Filled 2019-02-01: qty 2

## 2019-02-01 MED ORDER — THIAMINE HCL 100 MG PO TABS: 100.0000 mg | ORAL_TABLET | Freq: Every day | ORAL | Status: DC

## 2019-02-01 NOTE — ED Notes (Signed)
Hourly rounding reveals patient in room. No complaints, stable, in no acute distress. Q15 minute rounds and monitoring via Rover and Officer to continue.   

## 2019-02-01 NOTE — ED Notes (Signed)
IVC, once sober, reassess for d/c

## 2019-02-01 NOTE — ED Provider Notes (Signed)
Patient has been cleared by psychiatry for discharge.   Emily Filbert, MD 02/01/19 [phone]
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
## 2019-02-01 NOTE — ED Provider Notes (Signed)
-----------------------------------------   5:39 AM on 02/01/2019 -----------------------------------------   Blood pressure (!) 157/78, pulse (!) 106, temperature 98.8 F (37.1 C), temperature source Oral, resp. rate 18, height 6\' 2"  (1.88 m), weight 89.8 kg, SpO2 94 %.  The patient is sleeping at this time.  Felt tremulous overnight secondary to alcohol withdrawal and was placed on CIWA scale.  Psychiatry evaluated patient overnight and felt patient did not meet inpatient criteria.  May rescind IVC and discharge home once he is awake and sober.   , MD 02/01/19 [phone]

## 2019-02-01 NOTE — ED Notes (Signed)
Pt given bus pass and shuttle car called to transport pt to bus stop

## 2019-02-18 ENCOUNTER — Other Ambulatory Visit: Payer: Self-pay

## 2019-02-18 ENCOUNTER — Emergency Department: Payer: Medicaid Other

## 2019-02-18 ENCOUNTER — Emergency Department
Admission: EM | Admit: 2019-02-18 | Discharge: 2019-02-19 | Disposition: A | Discharge status: Home / Self Care | Payer: Medicaid Other | Attending: Emergency Medicine | Admitting: Emergency Medicine

## 2019-02-18 DIAGNOSIS — W19XXXA Unspecified fall, initial encounter: Secondary | ICD-10-CM | POA: Diagnosis not present

## 2019-02-18 DIAGNOSIS — Y92410 Unspecified street and highway as the place of occurrence of the external cause: Secondary | ICD-10-CM | POA: Insufficient documentation

## 2019-02-18 DIAGNOSIS — Y939 Activity, unspecified: Secondary | ICD-10-CM | POA: Diagnosis not present

## 2019-02-18 DIAGNOSIS — F10229 Alcohol dependence with intoxication, unspecified: Secondary | ICD-10-CM | POA: Diagnosis present

## 2019-02-18 DIAGNOSIS — M25511 Pain in right shoulder: Secondary | ICD-10-CM | POA: Insufficient documentation

## 2019-02-18 DIAGNOSIS — F10920 Alcohol use, unspecified with intoxication, uncomplicated: Secondary | ICD-10-CM

## 2019-02-18 DIAGNOSIS — Y999 Unspecified external cause status: Secondary | ICD-10-CM | POA: Diagnosis not present

## 2019-02-18 LAB — CBC
HCT: 40.4 % (ref 39.0–52.0)
Hemoglobin: 13.8 g/dL (ref 13.0–17.0)
MCH: 33.3 pg (ref 26.0–34.0)
MCHC: 34.2 g/dL (ref 30.0–36.0)
MCV: 97.3 fL (ref 80.0–100.0)
Platelets: 238 10*3/uL (ref 150–400)
RBC: 4.15 MIL/uL — ABNORMAL LOW (ref 4.22–5.81)
RDW: 12.5 % (ref 11.5–15.5)
WBC: 6 10*3/uL (ref 4.0–10.5)
nRBC: 0 % (ref 0.0–0.2)

## 2019-02-18 LAB — COMPREHENSIVE METABOLIC PANEL
ALT: 56 U/L — ABNORMAL HIGH (ref 0–44)
AST: 76 U/L — ABNORMAL HIGH (ref 15–41)
Albumin: 4 g/dL (ref 3.5–5.0)
Alkaline Phosphatase: 63 U/L (ref 38–126)
Anion gap: 7 (ref 5–15)
BUN: 8 mg/dL (ref 6–20)
CO2: 27 mmol/L (ref 22–32)
Calcium: 8.7 mg/dL — ABNORMAL LOW (ref 8.9–10.3)
Chloride: 108 mmol/L (ref 98–111)
Creatinine, Ser: 0.84 mg/dL (ref 0.61–1.24)
GFR calc Af Amer: >60 mL/min (ref >60)
GFR calc non Af Amer: >60 mL/min (ref >60)
Glucose, Bld: 104 mg/dL — ABNORMAL HIGH (ref 70–99)
Potassium: 3.8 mmol/L (ref 3.5–5.1)
Sodium: 142 mmol/L (ref 135–145)
Total Bilirubin: 0.5 mg/dL (ref 0.3–1.2)
Total Protein: 7.3 g/dL (ref 6.5–8.1)

## 2019-02-18 LAB — SALICYLATE LEVEL: Salicylate Lvl: <7 mg/dL — ABNORMAL LOW (ref 7.0–30.0)

## 2019-02-18 LAB — ACETAMINOPHEN LEVEL: Acetaminophen (Tylenol), Serum: <10 ug/mL — ABNORMAL LOW (ref 10–30)

## 2019-02-18 LAB — ETHANOL: Alcohol, Ethyl (B): 254 mg/dL — ABNORMAL HIGH (ref <10)

## 2019-02-18 NOTE — ED Triage Notes (Signed)
Patient was found on side of road where he had fallen.  Patient reports pain to right shoulder from where he fell tonight.  Patient states he is a "bad" alcoholic.  Patient reports he drinks daily between 1 pint and/or 8-10 (24 oz) beers.  Patient asking for help from injuries due to fall and with detox.

## 2019-02-18 NOTE — ED Triage Notes (Signed)
FIRST NURSE NOTE: Pt arrived via [facility], pt found lying on the side of the road, +etoh use, pt also has wound to right side of his head from fall 3 days ago.  Per EMS pt urinated on himself.  Pt has PMH of Hep C  VSS: 140/86 96% RA, P-96

## 2019-02-18 NOTE — ED Provider Notes (Signed)
[facility] Emergency Department Provider Note       Time seen: ----------------------------------------- 9:57 PM on 02/18/2019 ----------------------------------------- I have reviewed the triage vital signs and the nursing notes.  HISTORY   Chief Complaint Fall and Alcohol Intoxication   HPI Steven Gardner is a 59 y.o. male with a history of daily alcohol use who presents to the ED for a fall.  Patient was found on the side of the road where he had fallen.  He reports pain to the right shoulder he fell last night.  Patient states he is a "bad alcoholic".  Patient states he drinks daily between 1 plate and 5-73 beers.  He was asking for help from injuries due to a fall.  Patient denies wanting detox.  Patient states he had a wound to the right side of his head from a fall 3 days ago.  No past medical history on file.  There are no problems to display for this patient.   Allergies Patient has no known allergies.  Social History Social History   Tobacco Use  . Smoking status: Not on file  Substance Use Topics  . Alcohol use: Not on file  . Drug use: Not on file   Review of Systems Constitutional: Negative for fever. Cardiovascular: Negative for chest pain. Respiratory: Negative for shortness of breath. Gastrointestinal: Negative for abdominal pain, vomiting and diarrhea. Musculoskeletal: Positive for right shoulder pain Skin: Positive for right scalp wound Neurological: Negative for headaches, focal weakness or numbness.  All systems negative/normal/unremarkable except as stated in the HPI  ____________________________________________   PHYSICAL EXAM:  VITAL SIGNS: ED Triage Vitals  Enc Vitals Group     BP 02/18/19 1947 116/77     Pulse Rate 02/18/19 1947 90     Resp 02/18/19 1947 18     Temp 02/18/19 1947 98.2 F (36.8 C)     Temp Source 02/18/19 1947 Oral     SpO2 02/18/19 1947 99 %     Weight 02/18/19 1942 205 lb (93 kg)   Height 02/18/19 1942 5\' 11"  (1.803 m)     Head Circumference --      Peak Flow --      Pain Score 02/18/19 1942 10     Pain Loc --      Pain Edu? --      Excl. in [place]? --    Constitutional: Alert and oriented.  No acute distress Eyes: Conjunctivae are normal. Normal extraocular movements. ENT      Head: Normocephalic, old right temporal scalp wound is noted      Nose: No congestion/rhinnorhea.      Mouth/Throat: Mucous membranes are moist.      Neck: No stridor. Cardiovascular: Normal rate, regular rhythm. No murmurs, rubs, or gallops. Respiratory: Normal respiratory effort without tachypnea nor retractions. Breath sounds are clear and equal bilaterally. No wheezes/rales/rhonchi. Gastrointestinal: Soft and nontender. Normal bowel sounds Musculoskeletal: Nontender with normal range of motion in extremities. No lower extremity tenderness nor edema. Neurologic: Slightly slurred speech. No gross focal neurologic deficits are appreciated.  Skin:  Skin is warm, dry and intact. No rash noted. Psychiatric: Mood and affect are normal.  ____________________________________________  ED COURSE:  As part of my medical decision making, I reviewed the following data within the [place] History obtained from family if available, nursing notes, old chart and ekg, as well as notes from prior ED visits. Patient presented for alcohol intoxication with recent fall, we will assess with  labs and imaging as indicated at this time.   Procedures  Susa Griffins was evaluated in Emergency Department on 02/18/2019 for the symptoms described in the history of present illness. He was evaluated in the context of the global COVID-19 pandemic, which necessitated consideration that the patient might be at risk for infection with the SARS-CoV-2 virus that causes COVID-19. Institutional protocols and algorithms that pertain to the evaluation of patients at risk for COVID-19 are in a state of rapid  change based on information released by regulatory bodies including the CDC and federal and state organizations. These policies and algorithms were followed during the patient's care in the ED.  ____________________________________________   LABS (pertinent positives/negatives)  Labs Reviewed  COMPREHENSIVE METABOLIC PANEL - Abnormal; Notable for the following components:      Result Value   Glucose, Bld 104 (*)    Calcium 8.7 (*)    AST 76 (*)    ALT 56 (*)    All other components within normal limits  ETHANOL - Abnormal; Notable for the following components:   Alcohol, Ethyl (B) 254 (*)    All other components within normal limits  SALICYLATE LEVEL - Abnormal; Notable for the following components:   Salicylate Lvl <7.0 (*)    All other components within normal limits  ACETAMINOPHEN LEVEL - Abnormal; Notable for the following components:   Acetaminophen (Tylenol), Serum <10 (*)    All other components within normal limits  CBC - Abnormal; Notable for the following components:   RBC 4.15 (*)    All other components within normal limits  URINE DRUG SCREEN, QUALITATIVE ([facility] ONLY)    RADIOLOGY Images were viewed by me Right shoulder x-ray IMPRESSION: Degenerative changes RIGHT AC joint and osseous demineralization without acute bony abnormalities. ____________________________________________   DIFFERENTIAL DIAGNOSIS   Alcohol intoxication, fracture, contusion, dehydration, electrolyte abnormality  FINAL ASSESSMENT AND PLAN  Alcohol intoxication, fall, right shoulder pain   Plan: The patient had presented for alcohol intoxication with recent fall. Patient's labs indicated acute alcohol intoxication with a level of 254. Patient's imaging reveals degenerative changes without other acute process.  He is cleared for outpatient follow-up.   Ulice Dash, MD    Note: This note was generated in part or whole with voice recognition software. Voice recognition is  usually quite accurate but there are transcription errors that can and very often do occur. I apologize for any typographical errors that were not detected and corrected.     Emily Filbert, MD 02/18/19 2203

## 2019-03-01 ENCOUNTER — Emergency Department
Admission: EM | Admit: 2019-03-01 | Discharge: 2019-03-01 | Disposition: A | Discharge status: Home / Self Care | Payer: Medicaid Other | Attending: Emergency Medicine | Admitting: Emergency Medicine

## 2019-03-01 ENCOUNTER — Other Ambulatory Visit: Payer: Self-pay

## 2019-03-01 DIAGNOSIS — F10929 Alcohol use, unspecified with intoxication, unspecified: Secondary | ICD-10-CM

## 2019-03-01 DIAGNOSIS — F1010 Alcohol abuse, uncomplicated: Secondary | ICD-10-CM

## 2019-03-01 DIAGNOSIS — Y908 Blood alcohol level of 240 mg/100 ml or more: Secondary | ICD-10-CM | POA: Diagnosis not present

## 2019-03-01 DIAGNOSIS — M545 Low back pain, unspecified: Secondary | ICD-10-CM

## 2019-03-01 DIAGNOSIS — R4781 Slurred speech: Secondary | ICD-10-CM | POA: Diagnosis not present

## 2019-03-01 DIAGNOSIS — M19011 Primary osteoarthritis, right shoulder: Secondary | ICD-10-CM | POA: Insufficient documentation

## 2019-03-01 DIAGNOSIS — M25511 Pain in right shoulder: Secondary | ICD-10-CM | POA: Insufficient documentation

## 2019-03-01 LAB — BASIC METABOLIC PANEL
Anion gap: 14 (ref 5–15)
BUN: 12 mg/dL (ref 6–20)
CO2: 25 mmol/L (ref 22–32)
Calcium: 8.7 mg/dL — ABNORMAL LOW (ref 8.9–10.3)
Chloride: 105 mmol/L (ref 98–111)
Creatinine, Ser: 0.98 mg/dL (ref 0.61–1.24)
GFR calc Af Amer: >60 mL/min (ref >60)
GFR calc non Af Amer: >60 mL/min (ref >60)
Glucose, Bld: 93 mg/dL (ref 70–99)
Potassium: 4.1 mmol/L (ref 3.5–5.1)
Sodium: 144 mmol/L (ref 135–145)

## 2019-03-01 LAB — CBC WITH DIFFERENTIAL/PLATELET
Abs Immature Granulocytes: 0.02 10*3/uL (ref 0.00–0.07)
Basophils Absolute: 0.1 10*3/uL (ref 0.0–0.1)
Basophils Relative: 1 %
Eosinophils Absolute: 0.1 10*3/uL (ref 0.0–0.5)
Eosinophils Relative: 2 %
HCT: 42.8 % (ref 39.0–52.0)
Hemoglobin: 14.6 g/dL (ref 13.0–17.0)
Immature Granulocytes: 0 %
Lymphocytes Relative: 41 %
Lymphs Abs: 2.6 10*3/uL (ref 0.7–4.0)
MCH: 33.1 pg (ref 26.0–34.0)
MCHC: 34.1 g/dL (ref 30.0–36.0)
MCV: 97.1 fL (ref 80.0–100.0)
Monocytes Absolute: 0.7 10*3/uL (ref 0.1–1.0)
Monocytes Relative: 10 %
Neutro Abs: 2.9 10*3/uL (ref 1.7–7.7)
Neutrophils Relative %: 46 %
Platelets: 161 10*3/uL (ref 150–400)
RBC: 4.41 MIL/uL (ref 4.22–5.81)
RDW: 13.3 % (ref 11.5–15.5)
WBC: 6.4 10*3/uL (ref 4.0–10.5)
nRBC: 0 % (ref 0.0–0.2)

## 2019-03-01 LAB — URINALYSIS, COMPLETE (UACMP) WITH MICROSCOPIC
Bacteria, UA: NONE SEEN
Bilirubin Urine: NEGATIVE
Glucose, UA: NEGATIVE mg/dL
Hgb urine dipstick: NEGATIVE
Ketones, ur: NEGATIVE mg/dL
Leukocytes,Ua: NEGATIVE
Nitrite: NEGATIVE
Protein, ur: NEGATIVE mg/dL
Specific Gravity, Urine: 1.003 — ABNORMAL LOW (ref 1.005–1.030)
Squamous Epithelial / HPF: NONE SEEN (ref 0–5)
WBC, UA: NONE SEEN WBC/hpf (ref 0–5)
pH: 5 (ref 5.0–8.0)

## 2019-03-01 LAB — ETHANOL
Alcohol, Ethyl (B): 409 mg/dL (ref <10)
Alcohol, Ethyl (B): 293 mg/dL — ABNORMAL HIGH (ref <10)

## 2019-03-01 MED ORDER — SODIUM CHLORIDE 0.9 % IV BOLUS
1000.0000 mL | Freq: Once | INTRAVENOUS | Status: AC
  Administered 2019-03-01: 15:00:00 1000 mL via INTRAVENOUS

## 2019-03-01 MED ORDER — SODIUM CHLORIDE 0.9 % IV BOLUS
1000.0000 mL | Freq: Once | INTRAVENOUS | Status: AC
  Administered 2019-03-01: 1000 mL via INTRAVENOUS

## 2019-03-01 NOTE — ED Notes (Signed)
Ron- PA notified of ETOH 409. No new orders at this time

## 2019-03-01 NOTE — ED Provider Notes (Signed)
-----------------------------------------   6:55 PM on 03/01/2019 ----------------------------------------- Patient has been drinking fluids and ate a sandwich tray that was provided for him by the nursing staff.  Patient was up walking without assistance.  He still states that he has not want any help with his alcoholism.  He states that he plans to continue drinking but when asked he denies any suicidal or homicidal ideations.    Tommi Rumps, PA-C 03/01/19 1857    Emily Filbert, MD 03/01/19 2155

## 2019-03-01 NOTE — ED Notes (Addendum)
Pt continually making sexual comments to staff during discharge, pt reminded his behavior is inappropriate.  Pt in NAD at this time.  Pt denies any questions/concerns at this time.

## 2019-03-01 NOTE — ED Notes (Signed)
Pt given applesauce and repositioned in bed.

## 2019-03-01 NOTE — ED Notes (Signed)
Patient sleeping, oxygen saturation dropped to 88%.  This RN placed 2L oxygen by nasal cannula on patient, oxygen came up to 97%.

## 2019-03-01 NOTE — ED Notes (Signed)
Pt resting at this time NAD noted

## 2019-03-01 NOTE — ED Triage Notes (Signed)
Pt to ED via [facility] from for chief complaint of lower back pain and right shoulder pain. EMS reports he was found laying in grass on side of road. Pt appears intoxicated and slurry words. Pt reports he drinks alcohol daily and states he has had 24 oz beers x2 today.  Pt in NAD at this time.

## 2019-03-01 NOTE — ED Notes (Signed)
Pt awake and asking for food. Given sandwich tray
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
## 2019-03-01 NOTE — ED Notes (Signed)
Pt given ginger ale.  Pt awake,  taken off d/t SpO2 100%. SpO2 95% RA

## 2019-03-01 NOTE — ED Notes (Signed)
Pt was given a warm blanket and repositioned in bed.
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
## 2019-03-02 ENCOUNTER — Encounter: Payer: Self-pay | Admitting: Emergency Medicine

## 2019-03-02 ENCOUNTER — Other Ambulatory Visit: Payer: Self-pay

## 2019-03-02 ENCOUNTER — Emergency Department
Admission: EM | Admit: 2019-03-02 | Discharge: 2019-03-03 | Disposition: A | Discharge status: Home / Self Care | Payer: Medicaid Other | Attending: Emergency Medicine | Admitting: Emergency Medicine

## 2019-03-02 DIAGNOSIS — K920 Hematemesis: Secondary | ICD-10-CM | POA: Insufficient documentation

## 2019-03-02 DIAGNOSIS — Z87891 Personal history of nicotine dependence: Secondary | ICD-10-CM | POA: Insufficient documentation

## 2019-03-02 DIAGNOSIS — F10220 Alcohol dependence with intoxication, uncomplicated: Secondary | ICD-10-CM | POA: Insufficient documentation

## 2019-03-02 DIAGNOSIS — K567 Ileus, unspecified: Secondary | ICD-10-CM | POA: Diagnosis present

## 2019-03-02 DIAGNOSIS — Z8673 Personal history of transient ischemic attack (TIA), and cerebral infarction without residual deficits: Secondary | ICD-10-CM | POA: Insufficient documentation

## 2019-03-02 DIAGNOSIS — S61011A Laceration without foreign body of right thumb without damage to nail, initial encounter: Secondary | ICD-10-CM | POA: Diagnosis present

## 2019-03-02 DIAGNOSIS — E119 Type 2 diabetes mellitus without complications: Secondary | ICD-10-CM | POA: Insufficient documentation

## 2019-03-02 DIAGNOSIS — F1410 Cocaine abuse, uncomplicated: Secondary | ICD-10-CM

## 2019-03-02 DIAGNOSIS — X58XXXA Exposure to other specified factors, initial encounter: Secondary | ICD-10-CM | POA: Insufficient documentation

## 2019-03-02 DIAGNOSIS — R0789 Other chest pain: Secondary | ICD-10-CM | POA: Diagnosis not present

## 2019-03-02 DIAGNOSIS — R451 Restlessness and agitation: Secondary | ICD-10-CM | POA: Diagnosis not present

## 2019-03-02 DIAGNOSIS — F1010 Alcohol abuse, uncomplicated: Secondary | ICD-10-CM | POA: Diagnosis present

## 2019-03-02 DIAGNOSIS — F1910 Other psychoactive substance abuse, uncomplicated: Secondary | ICD-10-CM

## 2019-03-02 DIAGNOSIS — K852 Alcohol induced acute pancreatitis without necrosis or infection: Secondary | ICD-10-CM | POA: Diagnosis not present

## 2019-03-02 DIAGNOSIS — K2900 Acute gastritis without bleeding: Secondary | ICD-10-CM | POA: Insufficient documentation

## 2019-03-02 DIAGNOSIS — F10929 Alcohol use, unspecified with intoxication, unspecified: Secondary | ICD-10-CM

## 2019-03-02 DIAGNOSIS — G8929 Other chronic pain: Secondary | ICD-10-CM | POA: Diagnosis present

## 2019-03-02 DIAGNOSIS — Y908 Blood alcohol level of 240 mg/100 ml or more: Secondary | ICD-10-CM | POA: Insufficient documentation

## 2019-03-02 DIAGNOSIS — Z79899 Other long term (current) drug therapy: Secondary | ICD-10-CM | POA: Insufficient documentation

## 2019-03-02 DIAGNOSIS — N401 Enlarged prostate with lower urinary tract symptoms: Secondary | ICD-10-CM | POA: Insufficient documentation

## 2019-03-02 DIAGNOSIS — Z8619 Personal history of other infectious and parasitic diseases: Secondary | ICD-10-CM | POA: Diagnosis present

## 2019-03-02 DIAGNOSIS — Y999 Unspecified external cause status: Secondary | ICD-10-CM | POA: Insufficient documentation

## 2019-03-02 DIAGNOSIS — M545 Low back pain, unspecified: Secondary | ICD-10-CM | POA: Diagnosis present

## 2019-03-02 DIAGNOSIS — Y929 Unspecified place or not applicable: Secondary | ICD-10-CM | POA: Insufficient documentation

## 2019-03-02 DIAGNOSIS — R350 Frequency of micturition: Secondary | ICD-10-CM | POA: Diagnosis present

## 2019-03-02 DIAGNOSIS — M5412 Radiculopathy, cervical region: Secondary | ICD-10-CM | POA: Diagnosis present

## 2019-03-02 DIAGNOSIS — Y939 Activity, unspecified: Secondary | ICD-10-CM | POA: Insufficient documentation

## 2019-03-02 LAB — CBC WITH DIFFERENTIAL/PLATELET
Abs Immature Granulocytes: 0.03 10*3/uL (ref 0.00–0.07)
Basophils Absolute: 0.1 10*3/uL (ref 0.0–0.1)
Basophils Relative: 1 %
Eosinophils Absolute: 0 10*3/uL (ref 0.0–0.5)
Eosinophils Relative: 1 %
HCT: 43.6 % (ref 39.0–52.0)
Hemoglobin: 14.8 g/dL (ref 13.0–17.0)
Immature Granulocytes: 1 %
Lymphocytes Relative: 38 %
Lymphs Abs: 2.5 10*3/uL (ref 0.7–4.0)
MCH: 33.3 pg (ref 26.0–34.0)
MCHC: 33.9 g/dL (ref 30.0–36.0)
MCV: 98 fL (ref 80.0–100.0)
Monocytes Absolute: 0.7 10*3/uL (ref 0.1–1.0)
Monocytes Relative: 11 %
Neutro Abs: 3.2 10*3/uL (ref 1.7–7.7)
Neutrophils Relative %: 48 %
Platelets: 142 10*3/uL — ABNORMAL LOW (ref 150–400)
RBC: 4.45 MIL/uL (ref 4.22–5.81)
RDW: 13.2 % (ref 11.5–15.5)
WBC: 6.5 10*3/uL (ref 4.0–10.5)
nRBC: 0 % (ref 0.0–0.2)

## 2019-03-02 LAB — URINE DRUG SCREEN, QUALITATIVE (ARMC ONLY)
Amphetamines, Ur Screen: NOT DETECTED
Barbiturates, Ur Screen: NOT DETECTED
Benzodiazepine, Ur Scrn: POSITIVE — AB
Cannabinoid 50 Ng, Ur ~~LOC~~: NOT DETECTED
Cocaine Metabolite,Ur ~~LOC~~: POSITIVE — AB
MDMA (Ecstasy)Ur Screen: NOT DETECTED
Methadone Scn, Ur: NOT DETECTED
Opiate, Ur Screen: NOT DETECTED
Phencyclidine (PCP) Ur S: NOT DETECTED
Tricyclic, Ur Screen: NOT DETECTED

## 2019-03-02 LAB — BASIC METABOLIC PANEL
Anion gap: 11 (ref 5–15)
BUN: 9 mg/dL (ref 6–20)
CO2: 23 mmol/L (ref 22–32)
Calcium: 8.8 mg/dL — ABNORMAL LOW (ref 8.9–10.3)
Chloride: 109 mmol/L (ref 98–111)
Creatinine, Ser: 0.94 mg/dL (ref 0.61–1.24)
GFR calc Af Amer: >60 mL/min (ref >60)
GFR calc non Af Amer: >60 mL/min (ref >60)
Glucose, Bld: 97 mg/dL (ref 70–99)
Potassium: 4.1 mmol/L (ref 3.5–5.1)
Sodium: 143 mmol/L (ref 135–145)

## 2019-03-02 LAB — ETHANOL: Alcohol, Ethyl (B): 374 mg/dL (ref <10)

## 2019-03-02 NOTE — ED Notes (Addendum)
Pt is drunk on assessment, can answer questions but is agitated. Pt right hand with laceration that was wrapped by EMS.

## 2019-03-02 NOTE — ED Notes (Signed)
Pt. Indicated he was hungry.  Pt. Given meal tray and drink.
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
## 2019-03-02 NOTE — ED Notes (Signed)
Pt dressed out. Pt belongings bag:  1 Pair of boots

## 2019-03-02 NOTE — ED Notes (Signed)
Patient dressed out into burgundy scrubs, pt cooperative but rowdy at this time. Pt belongings placed in bag and set outside of room.   -2 boots  -2 socks -Jeans -Pair of black sweatpants -Pair of black underwear -Grey long-sleeve shirt -Wallet (black) -Jacket
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
## 2019-03-03 ENCOUNTER — Emergency Department: Payer: Medicaid Other

## 2019-03-03 LAB — TROPONIN I (HIGH SENSITIVITY): Troponin I (High Sensitivity): 5 ng/L (ref <18)

## 2019-03-03 MED ORDER — LORAZEPAM 2 MG PO TABS: 0.0000 mg | ORAL_TABLET | Freq: Two times a day (BID) | ORAL | Status: DC

## 2019-03-03 MED ORDER — THIAMINE HCL 100 MG PO TABS
100.0000 mg | ORAL_TABLET | Freq: Once | ORAL | Status: AC
  Administered 2019-03-03: 100 mg via ORAL
  Filled 2019-03-03: qty 1

## 2019-03-03 MED ORDER — LORAZEPAM 2 MG/ML IJ SOLN: 0.0000 mg | Freq: Four times a day (QID) | INTRAMUSCULAR | Status: DC

## 2019-03-03 MED ORDER — LORAZEPAM 2 MG/ML IJ SOLN: 0.0000 mg | Freq: Two times a day (BID) | INTRAMUSCULAR | Status: DC

## 2019-03-03 MED ORDER — LORAZEPAM 2 MG PO TABS
0.0000 mg | ORAL_TABLET | Freq: Four times a day (QID) | ORAL | Status: DC
  Administered 2019-03-03: 10:00:00 2 mg via ORAL
  Administered 2019-03-03: 1 mg via ORAL
  Filled 2019-03-03 (×2): qty 1

## 2019-03-03 MED ORDER — HYDROCODONE-ACETAMINOPHEN 5-325 MG PO TABS
1.0000 | ORAL_TABLET | Freq: Once | ORAL | Status: AC
  Administered 2019-03-03: 1 via ORAL
  Filled 2019-03-03: qty 1

## 2019-03-03 MED ORDER — THIAMINE HCL 100 MG/ML IJ SOLN: 100.0000 mg | Freq: Every day | INTRAMUSCULAR | Status: DC

## 2019-03-03 MED ORDER — TETANUS-DIPHTH-ACELL PERTUSSIS 5-2.5-18.5 LF-MCG/0.5 IM SUSP: 0.5000 mL | Freq: Once | INTRAMUSCULAR | Status: DC

## 2019-03-03 MED ORDER — ACETAMINOPHEN 325 MG PO TABS
650.0000 mg | ORAL_TABLET | Freq: Once | ORAL | Status: AC
  Administered 2019-03-03: 650 mg via ORAL
  Filled 2019-03-03: qty 2

## 2019-03-03 MED ORDER — IBUPROFEN 600 MG PO TABS
600.0000 mg | ORAL_TABLET | Freq: Once | ORAL | Status: AC
  Administered 2019-03-03: 600 mg via ORAL
  Filled 2019-03-03: qty 1

## 2019-03-03 NOTE — ED Notes (Signed)
Patient is ambulatory with a steady gait. Tremors still noted. Patient is alert and oriented x4. Dr. Derrill Kay is agreeable to discharge patient at this time. Patient has been given a bus pass.

## 2019-03-03 NOTE — Consult Note (Signed)
  Patient reassessed this morning.  Was assessed by NP and Dr. Lucianne Muss and they felt that he did not need admission at that time.  Patient reassessed after is given some time to sober up in the ED.  He denies any suicidal homicidal ideation or psychiatric symptoms.     IVC rescinded  No further psychiatric management

## 2019-03-03 NOTE — ED Notes (Signed)
Garage door open in room and patient placed on monitor, blood pressure cuff and pulse ox.

## 2019-03-03 NOTE — BH Assessment (Signed)
Writer spoke with patient again about his options for alcohol detox. He was interested in going to [facility] in [place] and no there place. Writer spoke with [facility] ([phone]) and they don't have any males bed. May have one this upcoming Sunday (03/05/2019) but not sure. Writer updated the patient and he understand he will be discharge from the ER when he is medically cleared. Writer updated ER MD (Dr. Derrill Kay).  Patient denies SI/HI and AV/H.

## 2019-03-03 NOTE — ED Provider Notes (Signed)
-----------------------------------------   11:33 AM on 03/03/2019 -----------------------------------------  Per Dr. Cindi Carbon from psychiatry, the patient is cleared from a psychiatric perspective.  The IVC has been rescinded.  The patient had some withdrawal symptoms earlier and got p.o. Ativan per the [facility] protocol.  He then reported some chest pain although he had this earlier in his evaluation as well.  An EKG at this time shows no acute abnormalities.  His chest x-ray previously also was negative.   ED ECG REPORT I, Dionne Bucy, the attending physician, personally viewed and interpreted this ECG.  Date: 03/03/2019 EKG Time: 1132 Rate: 82 Rhythm: normal sinus rhythm QRS Axis: normal Intervals: normal ST/T Wave abnormalities: normal Narrative Interpretation: no evidence of acute ischemia   ----------------------------------------- 12:42 PM on 03/03/2019 -----------------------------------------  On reassessment, the patient continues to complain of chest pain.  He appears relatively comfortable.  His vital signs are stable.  At this time, he does not have any criteria for inpatient admission although I am concerned for safe discharge given that he is homeless and may develop further withdrawal, so will discuss with [facility]TS the possibility of detox.  ----------------------------------------- 3:12 PM on 03/03/2019 -----------------------------------------  We are waiting to hear from the [facility]TS provider.  I signed the patient out to the oncoming physician Dr. Derrill Kay.   Dionne Bucy, MD 03/03/19 [phone]

## 2019-03-03 NOTE — ED Notes (Signed)
Psych at bedside.
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
## 2019-03-03 NOTE — ED Notes (Signed)
TTS and NP talking to patient.

## 2019-03-03 NOTE — ED Notes (Signed)
Pt given breakfast tray. Pt eating at this time.

## 2019-03-03 NOTE — ED Notes (Signed)
IVC rescinded/ Plan to D/C

## 2019-03-03 NOTE — ED Notes (Signed)
Patient has ride home with a friend.

## 2019-03-30 ENCOUNTER — Ambulatory Visit: Payer: Medicaid Other

## 2019-04-29 ENCOUNTER — Other Ambulatory Visit: Payer: Self-pay

## 2019-04-29 ENCOUNTER — Emergency Department
Admission: EM | Admit: 2019-04-29 | Discharge: 2019-04-30 | Disposition: A | Discharge status: Home / Self Care | Payer: Medicaid Other | Attending: Emergency Medicine | Admitting: Emergency Medicine

## 2019-04-29 DIAGNOSIS — F10129 Alcohol abuse with intoxication, unspecified: Secondary | ICD-10-CM | POA: Diagnosis present

## 2019-04-29 DIAGNOSIS — E119 Type 2 diabetes mellitus without complications: Secondary | ICD-10-CM | POA: Insufficient documentation

## 2019-04-29 DIAGNOSIS — Y908 Blood alcohol level of 240 mg/100 ml or more: Secondary | ICD-10-CM | POA: Insufficient documentation

## 2019-04-29 DIAGNOSIS — Z8673 Personal history of transient ischemic attack (TIA), and cerebral infarction without residual deficits: Secondary | ICD-10-CM | POA: Diagnosis not present

## 2019-04-29 DIAGNOSIS — Z87891 Personal history of nicotine dependence: Secondary | ICD-10-CM | POA: Insufficient documentation

## 2019-04-29 DIAGNOSIS — F10920 Alcohol use, unspecified with intoxication, uncomplicated: Secondary | ICD-10-CM | POA: Diagnosis not present

## 2019-04-29 LAB — URINE DRUG SCREEN, QUALITATIVE (ARMC ONLY)
Amphetamines, Ur Screen: NOT DETECTED
Barbiturates, Ur Screen: NOT DETECTED
Benzodiazepine, Ur Scrn: NOT DETECTED
Cannabinoid 50 Ng, Ur ~~LOC~~: NOT DETECTED
Cocaine Metabolite,Ur ~~LOC~~: NOT DETECTED
MDMA (Ecstasy)Ur Screen: NOT DETECTED
Methadone Scn, Ur: NOT DETECTED
Opiate, Ur Screen: NOT DETECTED
Phencyclidine (PCP) Ur S: NOT DETECTED
Tricyclic, Ur Screen: NOT DETECTED

## 2019-04-29 LAB — CBC WITH DIFFERENTIAL/PLATELET
Abs Immature Granulocytes: 0.03 10*3/uL (ref 0.00–0.07)
Basophils Absolute: 0.1 10*3/uL (ref 0.0–0.1)
Basophils Relative: 1 %
Eosinophils Absolute: 0.1 10*3/uL (ref 0.0–0.5)
Eosinophils Relative: 1 %
HCT: 43.8 % (ref 39.0–52.0)
Hemoglobin: 15.3 g/dL (ref 13.0–17.0)
Immature Granulocytes: 0 %
Lymphocytes Relative: 39 %
Lymphs Abs: 3.4 10*3/uL (ref 0.7–4.0)
MCH: 32.6 pg (ref 26.0–34.0)
MCHC: 34.9 g/dL (ref 30.0–36.0)
MCV: 93.2 fL (ref 80.0–100.0)
Monocytes Absolute: 0.8 10*3/uL (ref 0.1–1.0)
Monocytes Relative: 9 %
Neutro Abs: 4.3 10*3/uL (ref 1.7–7.7)
Neutrophils Relative %: 50 %
Platelets: 190 10*3/uL (ref 150–400)
RBC: 4.7 MIL/uL (ref 4.22–5.81)
RDW: 12.8 % (ref 11.5–15.5)
WBC: 8.7 10*3/uL (ref 4.0–10.5)
nRBC: 0 % (ref 0.0–0.2)

## 2019-04-29 LAB — ETHANOL: Alcohol, Ethyl (B): 331 mg/dL (ref <10)

## 2019-04-29 LAB — COMPREHENSIVE METABOLIC PANEL
ALT: 31 U/L (ref 0–44)
AST: 37 U/L (ref 15–41)
Albumin: 4.3 g/dL (ref 3.5–5.0)
Alkaline Phosphatase: 62 U/L (ref 38–126)
Anion gap: 9 (ref 5–15)
BUN: 13 mg/dL (ref 6–20)
CO2: 23 mmol/L (ref 22–32)
Calcium: 8.9 mg/dL (ref 8.9–10.3)
Chloride: 109 mmol/L (ref 98–111)
Creatinine, Ser: 0.97 mg/dL (ref 0.61–1.24)
GFR calc Af Amer: >60 mL/min (ref >60)
GFR calc non Af Amer: >60 mL/min (ref >60)
Glucose, Bld: 92 mg/dL (ref 70–99)
Potassium: 4.4 mmol/L (ref 3.5–5.1)
Sodium: 141 mmol/L (ref 135–145)
Total Bilirubin: 0.6 mg/dL (ref 0.3–1.2)
Total Protein: 8 g/dL (ref 6.5–8.1)

## 2019-04-29 MED ORDER — DIPHENHYDRAMINE HCL 50 MG/ML IJ SOLN
25.0000 mg | Freq: Once | INTRAMUSCULAR | Status: AC
  Administered 2019-04-29: 25 mg via INTRAVENOUS
  Filled 2019-04-29: qty 1

## 2019-04-29 MED ORDER — HALOPERIDOL LACTATE 5 MG/ML IJ SOLN
2.5000 mg | Freq: Once | INTRAMUSCULAR | Status: AC
  Administered 2019-04-29: 2.5 mg via INTRAVENOUS
  Filled 2019-04-29: qty 1

## 2019-04-29 NOTE — ED Triage Notes (Signed)
Pt arrives to ED from [facility] via [facility] with c/c of altered mental status. Pt found outside the [facility] by [employer] and asked to be brought in to the ED for evaluation. EMS reports patient was loud and confused during transport. Transport vitals reported as 134/92, p70, O2 sat 97% on room air. Upon arrival, pt alert and oriented to self and time but not location or situation. Pt denies drinking today or any substance abuse. Pt keeps apologizing for his state and states he is not sure if he wants to keep living,. Pt urinated on himself during assessment. Dr Erma Heritage at bedside.

## 2019-04-29 NOTE — ED Notes (Signed)
Pt continuously removes monitor leads despite education/redirection. Will medicate and reassess periodically.

## 2019-04-29 NOTE — ED Provider Notes (Signed)
[facility] Emergency Department Provider Note  ____________________________________________   First MD Initiated Contact with Patient 04/29/19 1949     (approximate)  I have reviewed the triage vital signs and the nursing notes.   HISTORY  Chief Complaint Alcohol Intoxication    HPI Steven Gardner is a 59 y.o. male with past medical history of chronic alcoholism, diabetes, stroke, here with alcohol intoxication.  History limited due to significant intoxication on arrival.  The patient reportedly was found outside of a hotel confused and belligerent.  On my assessment, he admits to recently relapsing on alcohol and states he is "messed up."  Denies any pain currently.  Denies any other drug use.  Remainder of history limited due to intoxication.  Level 5 caveat invoked as remainder of history, ROS, and physical exam limited due to patient's intoxication.        Past Medical History:  Diagnosis Date  . Hepatitis C   . Paresthesias   . Spinal stenosis   . Stroke [facility])     Patient Active Problem List   Diagnosis Date Noted  . Alcohol abuse   . Alcohol induced acute pancreatitis 11/29/2017  . Ileus (HCC) 11/29/2017  . Alcohol-induced pancreatitis 11/29/2017  . Acute gastritis without hemorrhage   . Hematemesis 11/07/2017  . Benign prostatic hyperplasia with urinary frequency 08/12/2017  . Type 2 diabetes mellitus (HCC) 08/03/2017  . Hx of hepatitis C 08/03/2017  . Chronic lower back pain 08/03/2017  . Cervical radiculopathy at C6 08/03/2017    Past Surgical History:  Procedure Laterality Date  . ELBOW SURGERY    . ESOPHAGOGASTRODUODENOSCOPY (EGD) WITH PROPOFOL N/A 11/08/2017   Procedure: ESOPHAGOGASTRODUODENOSCOPY (EGD) WITH PROPOFOL;  Surgeon: Midge Minium, MD;  Location: [facility] ENDOSCOPY;  Service: Endoscopy;  Laterality: N/A;  . KNEE SURGERY    . NECK SURGERY    . SPINE SURGERY      Prior to Admission medications     Medication Sig Start Date End Date Taking? Authorizing Provider  Multiple Vitamin (MULTIVITAMIN WITH MINERALS) TABS tablet Take 1 tablet by mouth daily. Patient not taking: Reported on 02/01/2019 11/09/17   Salary, Jetty Duhamel D, MD  pantoprazole (PROTONIX) 40 MG tablet Take 1 tablet (40 mg total) by mouth daily. Patient not taking: Reported on 02/01/2019 11/08/17   Salary, Evelena Asa, MD  ranitidine (ZANTAC) 150 MG tablet Take 1 tablet (150 mg total) by mouth 2 (two) times daily. Patient not taking: Reported on 02/01/2019 08/03/17   Kerman Passey, MD  tamsulosin (FLOMAX) 0.4 MG CAPS capsule Take 1 capsule (0.4 mg total) by mouth daily. Patient not taking: Reported on 02/01/2019 08/03/17   Kerman Passey, MD    Allergies Patient has no known allergies.  Family History  Problem Relation Age of Onset  . Stroke Paternal Grandfather     Social History Social History   Tobacco Use  . Smoking status: Former Games developer  . Smokeless tobacco: Never Used  Substance Use Topics  . Alcohol use: Yes    Comment: 8-10 40oz/beer daily  . Drug use: No    Review of Systems  Review of Systems  Unable to perform ROS: Mental status change     ____________________________________________  PHYSICAL EXAM:      VITAL SIGNS: ED Triage Vitals  Enc Vitals Group     BP 04/29/19 2000 112/88     Pulse Rate 04/29/19 2000 91     Resp 04/29/19 2000 18     Temp 04/29/19  2103 98 F (36.7 C)     Temp Source 04/29/19 2103 Oral     SpO2 04/29/19 2000 96 %     Weight 04/29/19 2001 220 lb (99.8 kg)     Height 04/29/19 2001 5\' 11"  (1.803 m)     Head Circumference --      Peak Flow --      Pain Score 04/29/19 2001 0     Pain Loc --      Pain Edu? --      Excl. in GC? --      Physical Exam Vitals and nursing note reviewed.  Constitutional:      General: He is not in acute distress.    Appearance: He is well-developed.     Comments: Disheveled, intoxicated, slurring words  HENT:     Head: Normocephalic and  atraumatic.  Eyes:     Conjunctiva/sclera: Conjunctivae normal.  Cardiovascular:     Rate and Rhythm: Normal rate and regular rhythm.     Heart sounds: Normal heart sounds. No murmur. No friction rub.  Pulmonary:     Effort: Pulmonary effort is normal. No respiratory distress.     Breath sounds: Normal breath sounds. No wheezing or rales.  Abdominal:     General: There is no distension.     Palpations: Abdomen is soft.     Tenderness: There is no abdominal tenderness.  Musculoskeletal:     Cervical back: Neck supple.  Skin:    General: Skin is warm.     Capillary Refill: Capillary refill takes less than 2 seconds.  Neurological:     Mental Status: He is alert. He is disoriented.     Motor: No abnormal muscle tone.     Comments: Slightly disoriented, but follows commands. No CN deficits. Speech slightly slurred. MAE with at least antigravity strength.       ____________________________________________   LABS (all labs ordered are listed, but only abnormal results are displayed)  Labs Reviewed  ETHANOL - Abnormal; Notable for the following components:      Result Value   Alcohol, Ethyl (B) 331 (*)    All other components within normal limits  CBC WITH DIFFERENTIAL/PLATELET  COMPREHENSIVE METABOLIC PANEL  URINE DRUG SCREEN, QUALITATIVE ([facility] ONLY)    ____________________________________________  EKG: None ________________________________________  RADIOLOGY All imaging, including plain films, CT scans, and ultrasounds, independently reviewed by me, and interpretations confirmed via formal radiology reads.  ED MD interpretation:   None  Official radiology report(s): No results found.  ____________________________________________  PROCEDURES   Procedure(s) performed (including Critical Care):  Procedures  ____________________________________________  INITIAL IMPRESSION / MDM / ASSESSMENT AND PLAN / ED COURSE  As part of my medical decision making, I  reviewed the following data within the electronic MEDICAL RECORD NUMBER Nursing notes reviewed and incorporated, Old chart reviewed, Notes from prior ED visits, and [place] Controlled Substance Database       *Steven Gardner was evaluated in Emergency Department on 04/30/2019 for the symptoms described in the history of present illness. He was evaluated in the context of the global COVID-19 pandemic, which necessitated consideration that the patient might be at risk for infection with the SARS-CoV-2 virus that causes COVID-19. Institutional protocols and algorithms that pertain to the evaluation of patients at risk for COVID-19 are in a state of rapid change based on information released by regulatory bodies including the CDC and federal and state organizations. These policies and algorithms were followed during the patient's care  in the ED.  Some ED evaluations and interventions may be delayed as a result of limited staffing during the pandemic.*     Medical Decision Making:  59 yo M with h/o alcohol dependence here with acute intoxication. No signs of trauma. He is hemodynamically stable. He made potential comments re: suicidal thoughts to nursing but denies any SI, HI on my interview. That being said, he is severely intoxicated. He is requesting medications for his anxiety and nausea. Feel that at this time, best plan of action is to give haldol for his agitation and nausea, allowing him time to metabolize. If patient attempts to leave prior to clinical sobriety, will ultimately need IVC given his comments. If, however, he is clinically sober and recants SI, HI, can dispo accordingly.  ____________________________________________  FINAL CLINICAL IMPRESSION(S) / ED DIAGNOSES  Final diagnoses:  None     MEDICATIONS GIVEN DURING THIS VISIT:  Medications  haloperidol lactate (HALDOL) injection 2.5 mg (2.5 mg Intravenous Given 04/29/19 2101)  diphenhydrAMINE (BENADRYL) injection 25 mg (25 mg  Intravenous Given 04/29/19 2100)     ED Discharge Orders    None       Note:  This document was prepared using Dragon voice recognition software and may include unintentional dictation errors.   Duffy Bruce, MD 04/30/19 [id number]

## 2019-04-30 NOTE — ED Notes (Signed)
Per S/O, she will be here in approx 20 min

## 2019-04-30 NOTE — ED Notes (Signed)
Pt urinated on floor.

## 2019-04-30 NOTE — ED Notes (Signed)
Pt still asleep at this time  

## 2019-04-30 NOTE — ED Notes (Signed)
Pt awake and responsive at this time. Calling S/O for transport back

## 2019-04-30 NOTE — ED Provider Notes (Signed)
-----------------------------------------   6:14 AM on 04/30/2019 -----------------------------------------  I took over care of this patient from Dr. Erma Heritage.  The patient presented intoxicated and apparently had expressed passive suicidal ideation.  At this time, the the patient is fully arousable and oriented x4.  He states that he feels tired but otherwise has no complaints.  He does not remember what happened last night.  He adamantly denies any suicidal ideation or other acute complaints.  He states he feels comfortable going home.  There is no evidence of danger to self or others.  We will call someone to pick him up.  Return precautions given, and he expresses understanding.   Dionne Bucy, MD 04/30/19 [id number]

## 2019-06-11 ENCOUNTER — Other Ambulatory Visit: Payer: Self-pay

## 2019-06-11 ENCOUNTER — Emergency Department
Admission: EM | Admit: 2019-06-11 | Discharge: 2019-06-12 | Disposition: A | Discharge status: Rehabilitation Facility | Payer: Medicaid Other | Attending: Emergency Medicine | Admitting: Emergency Medicine

## 2019-06-11 DIAGNOSIS — Z7984 Long term (current) use of oral hypoglycemic drugs: Secondary | ICD-10-CM | POA: Diagnosis not present

## 2019-06-11 DIAGNOSIS — F1020 Alcohol dependence, uncomplicated: Secondary | ICD-10-CM | POA: Diagnosis not present

## 2019-06-11 DIAGNOSIS — Z20822 Contact with and (suspected) exposure to covid-19: Secondary | ICD-10-CM | POA: Diagnosis not present

## 2019-06-11 DIAGNOSIS — R110 Nausea: Secondary | ICD-10-CM | POA: Insufficient documentation

## 2019-06-11 DIAGNOSIS — F1010 Alcohol abuse, uncomplicated: Secondary | ICD-10-CM

## 2019-06-11 DIAGNOSIS — F10139 Alcohol abuse with withdrawal, unspecified: Secondary | ICD-10-CM | POA: Diagnosis present

## 2019-06-11 DIAGNOSIS — E119 Type 2 diabetes mellitus without complications: Secondary | ICD-10-CM | POA: Insufficient documentation

## 2019-06-11 LAB — COMPREHENSIVE METABOLIC PANEL
ALT: 55 U/L — ABNORMAL HIGH (ref 0–44)
AST: 120 U/L — ABNORMAL HIGH (ref 15–41)
Albumin: 4.9 g/dL (ref 3.5–5.0)
Alkaline Phosphatase: 68 U/L (ref 38–126)
Anion gap: 16 — ABNORMAL HIGH (ref 5–15)
BUN: 11 mg/dL (ref 6–20)
CO2: 23 mmol/L (ref 22–32)
Calcium: 9.2 mg/dL (ref 8.9–10.3)
Chloride: 99 mmol/L (ref 98–111)
Creatinine, Ser: 0.91 mg/dL (ref 0.61–1.24)
GFR calc Af Amer: >60 mL/min (ref >60)
GFR calc non Af Amer: >60 mL/min (ref >60)
Glucose, Bld: 160 mg/dL — ABNORMAL HIGH (ref 70–99)
Potassium: 4.2 mmol/L (ref 3.5–5.1)
Sodium: 138 mmol/L (ref 135–145)
Total Bilirubin: 1.9 mg/dL — ABNORMAL HIGH (ref 0.3–1.2)
Total Protein: 8.6 g/dL — ABNORMAL HIGH (ref 6.5–8.1)

## 2019-06-11 LAB — CBC
HCT: 43.3 % (ref 39.0–52.0)
Hemoglobin: 15 g/dL (ref 13.0–17.0)
MCH: 32.5 pg (ref 26.0–34.0)
MCHC: 34.6 g/dL (ref 30.0–36.0)
MCV: 93.7 fL (ref 80.0–100.0)
Platelets: 115 10*3/uL — ABNORMAL LOW (ref 150–400)
RBC: 4.62 MIL/uL (ref 4.22–5.81)
RDW: 14.8 % (ref 11.5–15.5)
WBC: 7.5 10*3/uL (ref 4.0–10.5)
nRBC: 0 % (ref 0.0–0.2)

## 2019-06-11 LAB — SARS CORONAVIRUS 2 BY RT PCR (HOSPITAL ORDER, PERFORMED IN ~~LOC~~ HOSPITAL LAB): SARS Coronavirus 2: NEGATIVE

## 2019-06-11 LAB — ETHANOL: Alcohol, Ethyl (B): <10 mg/dL (ref <10)

## 2019-06-11 MED ORDER — HYDROCORTISONE 0.5 % EX CREA
TOPICAL_CREAM | CUTANEOUS | Status: DC | PRN
  Administered 2019-06-11: 1 via TOPICAL
  Filled 2019-06-11: qty 28.35

## 2019-06-11 MED ORDER — LORAZEPAM 2 MG/ML IJ SOLN: 0.0000 mg | Freq: Two times a day (BID) | INTRAMUSCULAR | Status: DC

## 2019-06-11 MED ORDER — LORAZEPAM 2 MG PO TABS
0.0000 mg | ORAL_TABLET | Freq: Four times a day (QID) | ORAL | Status: DC
  Administered 2019-06-11 – 2019-06-12 (×3): 2 mg via ORAL
  Filled 2019-06-11 (×3): qty 1

## 2019-06-11 MED ORDER — LORAZEPAM 2 MG/ML IJ SOLN
1.0000 mg | Freq: Once | INTRAMUSCULAR | Status: AC
  Administered 2019-06-11: 1 mg via INTRAVENOUS
  Filled 2019-06-11: qty 1

## 2019-06-11 MED ORDER — ONDANSETRON HCL 4 MG/2ML IJ SOLN
4.0000 mg | Freq: Once | INTRAMUSCULAR | Status: AC
  Administered 2019-06-11: 4 mg via INTRAVENOUS
  Filled 2019-06-11: qty 2

## 2019-06-11 MED ORDER — THIAMINE HCL 100 MG PO TABS
100.0000 mg | ORAL_TABLET | Freq: Every day | ORAL | Status: DC
  Administered 2019-06-11: 100 mg via ORAL
  Filled 2019-06-11: qty 1

## 2019-06-11 MED ORDER — LORAZEPAM 2 MG/ML IJ SOLN: 0.0000 mg | Freq: Four times a day (QID) | INTRAMUSCULAR | Status: DC

## 2019-06-11 MED ORDER — ONDANSETRON HCL 4 MG PO TABS
4.0000 mg | ORAL_TABLET | Freq: Three times a day (TID) | ORAL | Status: DC | PRN
  Administered 2019-06-11 – 2019-06-12 (×2): 4 mg via ORAL
  Filled 2019-06-11 (×2): qty 1

## 2019-06-11 MED ORDER — LORAZEPAM 2 MG PO TABS: 0.0000 mg | ORAL_TABLET | Freq: Two times a day (BID) | ORAL | Status: DC

## 2019-06-11 MED ORDER — THIAMINE HCL 100 MG/ML IJ SOLN: 100.0000 mg | Freq: Every day | INTRAMUSCULAR | Status: DC

## 2019-06-11 NOTE — BH Assessment (Addendum)
Tele Assessment Note   Patient Name: Steven Gardner MRN: [id number] Referring Physician: Location of Patient:  Location of Provider: [place] is an 59 y.o. male.  Patient presented to the ED as he has been trying to quit drinking but has been unsuccessful; Pt stated he was doing good with outpatient services at [facility] until May began and he started thinking about his Dad's death; Pt's Dad passed away 4 years ago on 07-05-2022; pt is struggling with both his parents death as his mother passed away on Xmas in 27-May-2010; pt is currently disabled and receives disability income ; pt is not SI nor HI currently nor within the past; pt did not endorse any A/V hallucinations; pt wants to quit drinking and be able to engage in treatment; pt admitted to being depressed as he was receiving treatment at [facility] for anxiety, depression and alcohol abuse; pt was agreeable to a referral for detox;   Diagnosis:  Axis I: Alcohol Use Disorder, Severe Axis II: deferred Axis III: see medical note Axis IV: access to substance abuse treatment   Past Medical History:  Past Medical History:  Diagnosis Date  . Hepatitis C   . Paresthesias   . Spinal stenosis   . Stroke [facility])     Past Surgical History:  Procedure Laterality Date  . ELBOW SURGERY    . ESOPHAGOGASTRODUODENOSCOPY (EGD) WITH PROPOFOL N/A 11/08/2017   Procedure: ESOPHAGOGASTRODUODENOSCOPY (EGD) WITH PROPOFOL;  Surgeon: Lucilla Lame, MD;  Location: [facility] ENDOSCOPY;  Service: Endoscopy;  Laterality: N/A;  . KNEE SURGERY    . NECK SURGERY    . SPINE SURGERY      Family History:  Family History  Problem Relation Age of Onset  . Stroke Paternal Grandfather     Social History:  reports that he has quit smoking. He has never used smokeless tobacco. He reports current alcohol use. He reports that he does not use drugs.  Additional Social History:     CIWA: CIWA-Ar BP: (!) 170/89 Pulse Rate:  88 Nausea and Vomiting: mild nausea with no vomiting Tactile Disturbances: very mild itching, pins and needles, burning or numbness Tremor: not visible, but can be felt fingertip to fingertip Auditory Disturbances: not present Paroxysmal Sweats: no sweat visible Visual Disturbances: not present Anxiety: mildly anxious Headache, Fullness in Head: none present Agitation: normal activity Orientation and Clouding of Sensorium: oriented and can do serial additions CIWA-Ar Total: 4 COWS:    Allergies: No Known Allergies  Home Medications: (Not in a hospital admission)   OB/GYN Status:  No LMP for male patient.  General Assessment Data Location of Assessment: [facility] ED TTS Assessment: In system Is this a Tele or Face-to-Face Assessment?: Face-to-Face Is this an Initial Assessment or a Re-assessment for this encounter?: Initial Assessment Patient Accompanied by:: N/A What gender do you identify as?: Male Marital status: Long term relationship Living Arrangements: Spouse/significant other Can pt return to current living arrangement?: Yes Admission Status: Voluntary  Medical Screening Exam ([place]) Medical Exam completed: Yes  Crisis Care Plan Living Arrangements: Spouse/significant other     Risk to self with the past 6 months Suicidal Ideation: No Has patient been a risk to self within the past 6 months prior to admission? : No Suicidal Intent: No Has patient had any suicidal intent within the past 6 months prior to admission? : Other (comment) Is patient at risk for suicide?: No Suicidal Plan?: No Has patient had any suicidal plan  within the past 6 months prior to admission? : No Access to Means: Yes Specify Access to Suicidal Means: household items What has been your use of drugs/alcohol within the last 12 months?: alcohol daily Previous Attempts/Gestures: No How many times?: 0 Other Self Harm Risks: drinking  Triggers for Past Attempts:  Anniversary Intentional Self Injurious Behavior: Damaging Comment - Self Injurious Behavior: alcohol Family Suicide History: No Recent stressful life event(s): Other (Comment)(anniversay of father's death pending on 23rd) Persecutory voices/beliefs?: No Depression: Yes Depression Symptoms: Isolating, Guilt, Tearfulness Substance abuse history and/or treatment for substance abuse?: Yes Suicide prevention information given to non-admitted patients: Yes  Risk to Others within the past 6 months Homicidal Ideation: No Does patient have any lifetime risk of violence toward others beyond the six months prior to admission? : Unknown Thoughts of Harm to Others: No Current Homicidal Intent: No Current Homicidal Plan: No Access to Homicidal Means: Yes Describe Access to Homicidal Means: household items Identified Victim: none History of harm to others?: No Assessment of Violence: None Noted Violent Behavior Description: cooperative Does patient have access to weapons?: No Does patient have a court date: No Is patient on probation?: No  Psychosis Hallucinations: None noted Delusions: None noted  Mental Status Report Appearance/Hygiene: Unremarkable Eye Contact: Fair Motor Activity: Agitation, Restlessness Speech: Logical/coherent, Soft Level of Consciousness: Alert Mood: Anxious, Irritable Affect: Anxious, Depressed, Irritable Anxiety Level: None Thought Processes: Coherent, Relevant Judgement: Impaired Orientation: Person, Place, Time, Situation Obsessive Compulsive Thoughts/Behaviors: None  Cognitive Functioning Concentration: Normal Memory: Recent Intact, Remote Intact Is patient IDD: No Insight: Poor Impulse Control: Poor Appetite: Fair Have you had any weight changes? : No Change Sleep: No Change Total Hours of Sleep: 6 Vegetative Symptoms: None  ADLScreening [facility] Assessment Services) Patient's cognitive ability adequate to safely complete daily activities?:  Yes Patient able to express need for assistance with ADLs?: Yes Independently performs ADLs?: Yes (appropriate for developmental age)  Prior Inpatient Therapy Prior Inpatient Therapy: No  Prior Outpatient Therapy Prior Outpatient Therapy: Yes Prior Therapy Dates: current Prior Therapy Facilty/Provider(s): [facility] Reason for Treatment: alcohol Does patient have an ACCT team?: No Does patient have Intensive In-House Services?  : No Does patient have [facility] services? : No Does patient have P4CC services?: No  ADL Screening (condition at time of admission) Patient's cognitive ability adequate to safely complete daily activities?: Yes Patient able to express need for assistance with ADLs?: Yes Independently performs ADLs?: Yes (appropriate for developmental age)             Merchant navy officer (For Healthcare) Does Patient Have a Medical Advance Directive?: No          Disposition: Refer pt to [facility] Disposition Initial Assessment Completed for this Encounter: Yes Disposition of Patient: (recommend to RTS)     Jinny Sanders, Renita Richmond 06/11/2019 4:32 PM

## 2019-06-11 NOTE — BH Assessment (Signed)
PATIENT BED AVAILABLE AT 10AM on 06/12/19  Patient has been accepted to [facility] Recovery Representative was Patty.   ER Staff is aware of it:  Operating Room Services ER Secretary  Dr. Cyril Loosen, ER MD  Dewayne Hatch Patient's Nurse  Address: [address] [place], [place] [address]  COVID results have been faxed to facility

## 2019-06-11 NOTE — ED Provider Notes (Signed)
Patient has been accepted at [facility], will go in the morning   Steven Every, MD 06/11/19 2049

## 2019-06-11 NOTE — ED Notes (Signed)
[facility] reviewing chart and will inform on decision tomorrow morning. Girlfriend called to be updated. No answer.

## 2019-06-11 NOTE — ED Notes (Signed)
First Nurse Note: Pt to ED via POV for emesis due ETOH withdrawal. Pt wife states that pt has been vomiting since 0900. Pt has hx/o ETOH abuse.

## 2019-06-11 NOTE — ED Notes (Signed)
Pt co nausea. Dr Cyril Loosen informed and will give new orders.

## 2019-06-11 NOTE — ED Provider Notes (Signed)
[facility] Emergency Department Provider Note   ____________________________________________    I have reviewed the triage vital signs and the nursing notes.   HISTORY  Chief Complaint Alcohol withdrawal     HPI Steven Gardner is a 59 y.o. male with history as noted below presents with complaints of alcohol withdrawal.  Patient reports he last drank yesterday.  This morning he started feeling shaky and nauseated.  He is requesting help with detox.  Denies abdominal pain.  Denies SI or HI.  Review of record demonstrates the patient has multiple visits with similar presentations.  He denies drug use  Past Medical History:  Diagnosis Date  . Hepatitis C   . Paresthesias   . Spinal stenosis   . Stroke [facility])     Patient Active Problem List   Diagnosis Date Noted  . Alcohol abuse   . Alcohol induced acute pancreatitis 11/29/2017  . Ileus ([place]) 11/29/2017  . Alcohol-induced pancreatitis 11/29/2017  . Acute gastritis without hemorrhage   . Hematemesis 11/07/2017  . Benign prostatic hyperplasia with urinary frequency 08/12/2017  . Type 2 diabetes mellitus ([place]) 08/03/2017  . Hx of hepatitis C 08/03/2017  . Chronic lower back pain 08/03/2017  . Cervical radiculopathy at C6 08/03/2017    Past Surgical History:  Procedure Laterality Date  . ELBOW SURGERY    . ESOPHAGOGASTRODUODENOSCOPY (EGD) WITH PROPOFOL N/A 11/08/2017   Procedure: ESOPHAGOGASTRODUODENOSCOPY (EGD) WITH PROPOFOL;  Surgeon: Lucilla Lame, MD;  Location: [facility] ENDOSCOPY;  Service: Endoscopy;  Laterality: N/A;  . KNEE SURGERY    . NECK SURGERY    . SPINE SURGERY      Prior to Admission medications   Medication Sig Start Date End Date Taking? Authorizing Provider  Multiple Vitamin (MULTIVITAMIN WITH MINERALS) TABS tablet Take 1 tablet by mouth daily. Patient not taking: Reported on 02/01/2019 11/09/17   Salary, Holly Bodily D, MD  pantoprazole (PROTONIX) 40 MG tablet Take 1  tablet (40 mg total) by mouth daily. Patient not taking: Reported on 02/01/2019 11/08/17   Salary, Avel Peace, MD  ranitidine (ZANTAC) 150 MG tablet Take 1 tablet (150 mg total) by mouth 2 (two) times daily. Patient not taking: Reported on 02/01/2019 08/03/17   Arnetha Courser, MD  tamsulosin (FLOMAX) 0.4 MG CAPS capsule Take 1 capsule (0.4 mg total) by mouth daily. Patient not taking: Reported on 02/01/2019 08/03/17   Arnetha Courser, MD     Allergies Patient has no known allergies.  Family History  Problem Relation Age of Onset  . Stroke Paternal Grandfather     Social History Social History   Tobacco Use  . Smoking status: Former Research scientist (life sciences)  . Smokeless tobacco: Never Used  Substance Use Topics  . Alcohol use: Yes    Comment: 8-10 40oz/beer daily  . Drug use: No    Review of Systems  Constitutional: No fever/chills Eyes: No visual changes.  ENT: No sore throat. Cardiovascular: Denies chest pain. Respiratory: Denies shortness of breath. Gastrointestinal: Positive nausea positive vomiting Genitourinary: Negative for dysuria. Musculoskeletal: Negative for back pain. Skin: Negative for rash. Neurological: Negative for headaches   ____________________________________________   PHYSICAL EXAM:  VITAL SIGNS: ED Triage Vitals [06/11/19 1520]  Enc Vitals Group     BP (!) 170/89     Pulse Rate 88     Resp 19     Temp 98.1 F (36.7 C)     Temp src      SpO2 96 %  Weight 74.8 kg (165 lb)     Height 1.816 m (5' 11.5")     Head Circumference      Peak Flow      Pain Score 5     Pain Loc      Pain Edu?      Excl. in GC?     Constitutional: Alert and oriented.   Nose: No congestion/rhinnorhea. Mouth/Throat: Mucous membranes are moist.    Cardiovascular: Normal rate, regular rhythm. Peri [place] peripheral circulation. Respiratory: Normal respiratory effort.  No retractions.  Gastrointestinal: Soft and nontender. No distention.   Musculoskeletal: No lower extremity  tenderness nor edema.  Warm and well perfused Neurologic:  Normal speech and language. No gross focal neurologic deficits are appreciated.  Skin:  Skin is warm, dry and intact. No rash noted. Psychiatric: Mood and affect are normal. Speech and behavior are normal.  ____________________________________________   LABS (all labs ordered are listed, but only abnormal results are displayed)  Labs Reviewed  COMPREHENSIVE METABOLIC PANEL - Abnormal; Notable for the following components:      Result Value   Glucose, Bld 160 (*)    Total Protein 8.6 (*)    AST 120 (*)    ALT 55 (*)    Total Bilirubin 1.9 (*)    Anion gap 16 (*)    All other components within normal limits  CBC - Abnormal; Notable for the following components:   Platelets 115 (*)    All other components within normal limits  SARS CORONAVIRUS 2 BY RT PCR (HOSPITAL ORDER, PERFORMED IN [facility])  ETHANOL  URINE DRUG SCREEN, QUALITATIVE ([facility] ONLY)   ____________________________________________  EKG  None ____________________________________________  RADIOLOGY  None ____________________________________________   PROCEDURES  Procedure(s) performed: No  Procedures   Critical Care performed: No ____________________________________________   INITIAL IMPRESSION / ASSESSMENT AND PLAN / ED COURSE  Pertinent labs & imaging results that were available during my care of the patient were reviewed by me and considered in my medical decision making (see chart for details).  Patient presents with nausea vomiting complains of alcohol withdrawal.  Vital signs are overall reassuring, mild hypertension, no tachycardia, positive nausea.  Will give IV Ativan, IV Zofran, pending labs.  Will asked TTS to evaluate the patient for detox facility.  Lab work is reassuring, coronavirus test is negative. Patient is medically cleared for alcohol detox. CIWA protocol initiated  The patient has been placed in  psychiatric observation due to the need to provide a safe environment for the patient while obtaining psychiatric consultation and evaluation, as well as ongoing medical and medication management to treat the patient's condition.  The patient has not been placed under full IVC at this time.     ____________________________________________   FINAL CLINICAL IMPRESSION(S) / ED DIAGNOSES  Final diagnoses:  Alcohol abuse        Note:  This document was prepared using Dragon voice recognition software and may include unintentional dictation errors.   Jene Every, MD 06/11/19 1950

## 2019-06-11 NOTE — ED Notes (Signed)
Pt refused meal tray °

## 2019-06-11 NOTE — ED Notes (Signed)
Message left with girlfriend about bringing pt to [facility] tomorrow morning.

## 2019-06-11 NOTE — ED Notes (Signed)
Pt walked to bathroom with x2 assist.

## 2019-06-11 NOTE — ED Triage Notes (Addendum)
Pt here for alcohol withdrawal. Pt states last drink was yesterday. Pt states he is starting to experience withdrawals and wants help to quit.  Pt states he normally drinks about a 6 pack a day which has decreased since he is having blackouts.  Pt states some nausea and belly. Pt states vomiting as well and unable to keep anything down.  Pt has noticeable tremors.

## 2019-06-11 NOTE — ED Notes (Signed)
Girlfriend able to take patient to [facility]. Girlfriend will be here at 7am to take patient.

## 2019-06-11 NOTE — B[facility] Counselor (Signed)
TTS for 5/17 AM SHIFT will need to call [facility] at [phone] ask for Patty for an admission update as their Doctor needs to review his information

## 2019-06-11 NOTE — [facility] Counselor (Signed)
Pt would like a referral to [facility]; spoke with Nurse at [facility] [phone] stating no current beds, but may have openings on 5/17 AM; Requested information to be faxed over including a COVID TEST;   Please send a copy of the COVID TEST to [phone]

## 2019-06-12 NOTE — ED Notes (Signed)
Accepted to [facility] this Am/[facility] will pickup

## 2019-06-12 NOTE — Discharge Instructions (Signed)
Thank you for letting us take care of you in the emergency department today.   Please continue to take any regular, prescribed medications. Good luck at [facility]!  Please return to the ER for any new or worsening symptoms.

## 2019-06-12 NOTE — ED Notes (Signed)
Pt asleep, breakfast tray placed on bed.  

## 2019-06-12 NOTE — ED Notes (Signed)
Patient states that he is not ready to leave, but He understands that [facility] has a bed for him today, He is shaky and states that He feels really anxious and knows that he is going to die if He doesn't stop drinking, talked about His Dad and how He misses him, as He passes away 4 years ago, will continue to monitor.

## 2019-06-12 NOTE — ED Notes (Signed)
Patient voices understanding of discharge instructions to the [facility], no signs of distress, all belongings given back to Patient. Steven Gardner is transporting to [facility] in [place].

## 2019-06-27 ENCOUNTER — Ambulatory Visit: Payer: Medicaid Other

## 2019-06-28 ENCOUNTER — Ambulatory Visit: Payer: Medicaid Other | Admitting: Adult Health

## 2019-07-06 ENCOUNTER — Encounter: Payer: Self-pay | Admitting: Emergency Medicine

## 2019-07-06 ENCOUNTER — Inpatient Hospital Stay: Payer: Medicaid Other

## 2019-07-06 ENCOUNTER — Other Ambulatory Visit: Payer: Self-pay

## 2019-07-06 ENCOUNTER — Inpatient Hospital Stay
Admission: EM | Admit: 2019-07-06 | Discharge: 2019-07-09 | DRG: 896 | Payer: Medicaid Other | Attending: Internal Medicine | Admitting: Internal Medicine

## 2019-07-06 DIAGNOSIS — F10930 Alcohol use, unspecified with withdrawal, uncomplicated: Secondary | ICD-10-CM

## 2019-07-06 DIAGNOSIS — Z20822 Contact with and (suspected) exposure to covid-19: Secondary | ICD-10-CM | POA: Diagnosis present

## 2019-07-06 DIAGNOSIS — Z79899 Other long term (current) drug therapy: Secondary | ICD-10-CM

## 2019-07-06 DIAGNOSIS — B1920 Unspecified viral hepatitis C without hepatic coma: Secondary | ICD-10-CM | POA: Diagnosis present

## 2019-07-06 DIAGNOSIS — D696 Thrombocytopenia, unspecified: Secondary | ICD-10-CM | POA: Diagnosis not present

## 2019-07-06 DIAGNOSIS — Y900 Blood alcohol level of less than 20 mg/100 ml: Secondary | ICD-10-CM | POA: Diagnosis present

## 2019-07-06 DIAGNOSIS — F10221 Alcohol dependence with intoxication delirium: Secondary | ICD-10-CM | POA: Diagnosis present

## 2019-07-06 DIAGNOSIS — D6959 Other secondary thrombocytopenia: Secondary | ICD-10-CM | POA: Diagnosis present

## 2019-07-06 DIAGNOSIS — Z87891 Personal history of nicotine dependence: Secondary | ICD-10-CM

## 2019-07-06 DIAGNOSIS — N401 Enlarged prostate with lower urinary tract symptoms: Secondary | ICD-10-CM | POA: Diagnosis present

## 2019-07-06 DIAGNOSIS — Z8619 Personal history of other infectious and parasitic diseases: Secondary | ICD-10-CM | POA: Diagnosis present

## 2019-07-06 DIAGNOSIS — K859 Acute pancreatitis without necrosis or infection, unspecified: Secondary | ICD-10-CM | POA: Diagnosis present

## 2019-07-06 DIAGNOSIS — F10231 Alcohol dependence with withdrawal delirium: Principal | ICD-10-CM | POA: Diagnosis present

## 2019-07-06 DIAGNOSIS — Z8673 Personal history of transient ischemic attack (TIA), and cerebral infarction without residual deficits: Secondary | ICD-10-CM

## 2019-07-06 DIAGNOSIS — R350 Frequency of micturition: Secondary | ICD-10-CM | POA: Diagnosis present

## 2019-07-06 DIAGNOSIS — E119 Type 2 diabetes mellitus without complications: Secondary | ICD-10-CM | POA: Diagnosis present

## 2019-07-06 DIAGNOSIS — F10230 Alcohol dependence with withdrawal, uncomplicated: Secondary | ICD-10-CM

## 2019-07-06 DIAGNOSIS — F1010 Alcohol abuse, uncomplicated: Secondary | ICD-10-CM | POA: Diagnosis present

## 2019-07-06 DIAGNOSIS — F10921 Alcohol use, unspecified with intoxication delirium: Secondary | ICD-10-CM | POA: Diagnosis present

## 2019-07-06 DIAGNOSIS — F10931 Alcohol use, unspecified with withdrawal delirium: Secondary | ICD-10-CM

## 2019-07-06 DIAGNOSIS — Z823 Family history of stroke: Secondary | ICD-10-CM | POA: Diagnosis not present

## 2019-07-06 LAB — URINALYSIS, COMPLETE (UACMP) WITH MICROSCOPIC
Bacteria, UA: NONE SEEN
Bilirubin Urine: NEGATIVE
Glucose, UA: 150 mg/dL — AB
Hgb urine dipstick: NEGATIVE
Ketones, ur: 20 mg/dL — AB
Leukocytes,Ua: NEGATIVE
Nitrite: NEGATIVE
Protein, ur: 30 mg/dL — AB
Specific Gravity, Urine: 1.019 (ref 1.005–1.030)
Squamous Epithelial / HPF: NONE SEEN (ref 0–5)
WBC, UA: NONE SEEN WBC/hpf (ref 0–5)
pH: 8 (ref 5.0–8.0)

## 2019-07-06 LAB — CBC
HCT: 43.6 % (ref 39.0–52.0)
HCT: 39.5 % (ref 39.0–52.0)
Hemoglobin: 15 g/dL (ref 13.0–17.0)
Hemoglobin: 13.6 g/dL (ref 13.0–17.0)
MCH: 33 pg (ref 26.0–34.0)
MCH: 33.5 pg (ref 26.0–34.0)
MCHC: 34.4 g/dL (ref 30.0–36.0)
MCHC: 34.4 g/dL (ref 30.0–36.0)
MCV: 96 fL (ref 80.0–100.0)
MCV: 97.3 fL (ref 80.0–100.0)
Platelets: 103 10*3/uL — ABNORMAL LOW (ref 150–400)
Platelets: 78 10*3/uL — ABNORMAL LOW (ref 150–400)
RBC: 4.54 MIL/uL (ref 4.22–5.81)
RBC: 4.06 MIL/uL — ABNORMAL LOW (ref 4.22–5.81)
RDW: 13.5 % (ref 11.5–15.5)
RDW: 13.2 % (ref 11.5–15.5)
WBC: 7.7 10*3/uL (ref 4.0–10.5)
WBC: 6.6 10*3/uL (ref 4.0–10.5)
nRBC: 0 % (ref 0.0–0.2)
nRBC: 0 % (ref 0.0–0.2)

## 2019-07-06 LAB — COMPREHENSIVE METABOLIC PANEL
ALT: 49 U/L — ABNORMAL HIGH (ref 0–44)
ALT: 39 U/L (ref 0–44)
AST: 90 U/L — ABNORMAL HIGH (ref 15–41)
AST: 68 U/L — ABNORMAL HIGH (ref 15–41)
Albumin: 4.8 g/dL (ref 3.5–5.0)
Albumin: 4.2 g/dL (ref 3.5–5.0)
Alkaline Phosphatase: 64 U/L (ref 38–126)
Alkaline Phosphatase: 50 U/L (ref 38–126)
Anion gap: 13 (ref 5–15)
Anion gap: 8 (ref 5–15)
BUN: 11 mg/dL (ref 6–20)
BUN: 11 mg/dL (ref 6–20)
CO2: 25 mmol/L (ref 22–32)
CO2: 26 mmol/L (ref 22–32)
Calcium: 9.2 mg/dL (ref 8.9–10.3)
Calcium: 8.6 mg/dL — ABNORMAL LOW (ref 8.9–10.3)
Chloride: 101 mmol/L (ref 98–111)
Chloride: 102 mmol/L (ref 98–111)
Creatinine, Ser: 0.95 mg/dL (ref 0.61–1.24)
Creatinine, Ser: 0.92 mg/dL (ref 0.61–1.24)
GFR calc Af Amer: >60 mL/min (ref >60)
GFR calc Af Amer: >60 mL/min (ref >60)
GFR calc non Af Amer: >60 mL/min (ref >60)
GFR calc non Af Amer: >60 mL/min (ref >60)
Glucose, Bld: 156 mg/dL — ABNORMAL HIGH (ref 70–99)
Glucose, Bld: 95 mg/dL (ref 70–99)
Potassium: 4.4 mmol/L (ref 3.5–5.1)
Potassium: 4.1 mmol/L (ref 3.5–5.1)
Sodium: 139 mmol/L (ref 135–145)
Sodium: 136 mmol/L (ref 135–145)
Total Bilirubin: 1.7 mg/dL — ABNORMAL HIGH (ref 0.3–1.2)
Total Bilirubin: 1.8 mg/dL — ABNORMAL HIGH (ref 0.3–1.2)
Total Protein: 9 g/dL — ABNORMAL HIGH (ref 6.5–8.1)
Total Protein: 7.7 g/dL (ref 6.5–8.1)

## 2019-07-06 LAB — URINE DRUG SCREEN, QUALITATIVE (ARMC ONLY)
Amphetamines, Ur Screen: NOT DETECTED
Barbiturates, Ur Screen: NOT DETECTED
Benzodiazepine, Ur Scrn: POSITIVE — AB
Cannabinoid 50 Ng, Ur ~~LOC~~: NOT DETECTED
Cocaine Metabolite,Ur ~~LOC~~: NOT DETECTED
MDMA (Ecstasy)Ur Screen: NOT DETECTED
Methadone Scn, Ur: NOT DETECTED
Opiate, Ur Screen: NOT DETECTED
Phencyclidine (PCP) Ur S: NOT DETECTED
Tricyclic, Ur Screen: NOT DETECTED

## 2019-07-06 LAB — ACETAMINOPHEN LEVEL: Acetaminophen (Tylenol), Serum: <10 ug/mL — ABNORMAL LOW (ref 10–30)

## 2019-07-06 LAB — LIPASE, BLOOD: Lipase: 59 U/L — ABNORMAL HIGH (ref 11–51)

## 2019-07-06 LAB — SALICYLATE LEVEL: Salicylate Lvl: <7 mg/dL — ABNORMAL LOW (ref 7.0–30.0)

## 2019-07-06 LAB — VALPROIC ACID LEVEL: Valproic Acid Lvl: <10 ug/mL — ABNORMAL LOW (ref 50.0–100.0)

## 2019-07-06 LAB — SARS CORONAVIRUS 2 BY RT PCR (HOSPITAL ORDER, PERFORMED IN ~~LOC~~ HOSPITAL LAB): SARS Coronavirus 2: NEGATIVE

## 2019-07-06 LAB — ETHANOL: Alcohol, Ethyl (B): <10 mg/dL (ref <10)

## 2019-07-06 LAB — PHOSPHORUS: Phosphorus: 2.7 mg/dL (ref 2.5–4.6)

## 2019-07-06 LAB — MAGNESIUM: Magnesium: 2.3 mg/dL (ref 1.7–2.4)

## 2019-07-06 MED ORDER — ONDANSETRON HCL 4 MG/2ML IJ SOLN
4.0000 mg | Freq: Once | INTRAMUSCULAR | Status: AC
  Administered 2019-07-06: 4 mg via INTRAVENOUS
  Filled 2019-07-06: qty 2

## 2019-07-06 MED ORDER — LORAZEPAM 2 MG PO TABS
0.0000 mg | ORAL_TABLET | Freq: Two times a day (BID) | ORAL | Status: DC
  Administered 2019-07-09: 1 mg via ORAL
  Filled 2019-07-06: qty 1

## 2019-07-06 MED ORDER — THIAMINE HCL 100 MG/ML IJ SOLN
Freq: Once | INTRAVENOUS | Status: AC
  Filled 2019-07-06: qty 1000

## 2019-07-06 MED ORDER — ACETAMINOPHEN 650 MG RE SUPP: 650.0000 mg | Freq: Four times a day (QID) | RECTAL | Status: DC | PRN

## 2019-07-06 MED ORDER — SODIUM CHLORIDE 0.9% FLUSH
10.0000 mL | Freq: Two times a day (BID) | INTRAVENOUS | Status: DC
  Administered 2019-07-07 – 2019-07-09 (×4): 10 mL

## 2019-07-06 MED ORDER — ONDANSETRON HCL 4 MG PO TABS
4.0000 mg | ORAL_TABLET | Freq: Four times a day (QID) | ORAL | Status: DC | PRN
  Administered 2019-07-07 – 2019-07-09 (×3): 4 mg via ORAL
  Filled 2019-07-06 (×3): qty 1

## 2019-07-06 MED ORDER — LORAZEPAM 1 MG PO TABS
1.0000 mg | ORAL_TABLET | ORAL | Status: DC | PRN
  Administered 2019-07-08: 1 mg via ORAL
  Filled 2019-07-06: qty 1

## 2019-07-06 MED ORDER — ADULT MULTIVITAMIN W/MINERALS CH
1.0000 | ORAL_TABLET | Freq: Every day | ORAL | Status: DC
  Administered 2019-07-07 – 2019-07-09 (×3): 1 via ORAL
  Filled 2019-07-06 (×3): qty 1

## 2019-07-06 MED ORDER — LORAZEPAM 2 MG PO TABS
0.0000 mg | ORAL_TABLET | Freq: Four times a day (QID) | ORAL | Status: AC
  Administered 2019-07-08: 2 mg via ORAL
  Filled 2019-07-06 (×2): qty 1

## 2019-07-06 MED ORDER — FOLIC ACID 1 MG PO TABS
1.0000 mg | ORAL_TABLET | Freq: Every day | ORAL | Status: DC
  Administered 2019-07-07 – 2019-07-09 (×3): 1 mg via ORAL
  Filled 2019-07-06 (×3): qty 1

## 2019-07-06 MED ORDER — PANTOPRAZOLE SODIUM 40 MG IV SOLR
40.0000 mg | INTRAVENOUS | Status: DC
  Administered 2019-07-07 – 2019-07-08 (×2): 40 mg via INTRAVENOUS
  Filled 2019-07-06 (×2): qty 40

## 2019-07-06 MED ORDER — LORAZEPAM 2 MG/ML IJ SOLN
0.0000 mg | Freq: Four times a day (QID) | INTRAMUSCULAR | Status: AC
  Administered 2019-07-06: 2 mg via INTRAVENOUS
  Administered 2019-07-06: 1 mg via INTRAVENOUS
  Administered 2019-07-07 (×3): 2 mg via INTRAVENOUS
  Filled 2019-07-06 (×4): qty 1

## 2019-07-06 MED ORDER — LORAZEPAM 2 MG/ML IJ SOLN
4.0000 mg | Freq: Once | INTRAMUSCULAR | Status: AC
  Administered 2019-07-06: 4 mg via INTRAVENOUS
  Filled 2019-07-06: qty 2

## 2019-07-06 MED ORDER — THIAMINE HCL 100 MG PO TABS
100.0000 mg | ORAL_TABLET | Freq: Every day | ORAL | Status: DC
  Administered 2019-07-07 – 2019-07-09 (×3): 100 mg via ORAL
  Filled 2019-07-06 (×3): qty 1

## 2019-07-06 MED ORDER — THIAMINE HCL 100 MG/ML IJ SOLN: 100.0000 mg | Freq: Every day | INTRAMUSCULAR | Status: DC

## 2019-07-06 MED ORDER — THIAMINE HCL 100 MG/ML IJ SOLN
100.0000 mg | Freq: Every day | INTRAMUSCULAR | Status: DC
  Administered 2019-07-06: 100 mg via INTRAVENOUS
  Filled 2019-07-06: qty 2

## 2019-07-06 MED ORDER — ACETAMINOPHEN 325 MG PO TABS
650.0000 mg | ORAL_TABLET | Freq: Four times a day (QID) | ORAL | Status: DC | PRN
  Administered 2019-07-06 – 2019-07-09 (×3): 650 mg via ORAL
  Filled 2019-07-06 (×3): qty 2

## 2019-07-06 MED ORDER — THIAMINE HCL 100 MG PO TABS: 100.0000 mg | ORAL_TABLET | Freq: Every day | ORAL | Status: DC

## 2019-07-06 MED ORDER — LORAZEPAM 2 MG/ML IJ SOLN
1.0000 mg | INTRAMUSCULAR | Status: DC | PRN
  Administered 2019-07-08: 2 mg via INTRAVENOUS
  Filled 2019-07-06: qty 1

## 2019-07-06 MED ORDER — LORAZEPAM 2 MG/ML IJ SOLN
0.0000 mg | Freq: Two times a day (BID) | INTRAMUSCULAR | Status: DC
  Administered 2019-07-08: 2 mg via INTRAVENOUS
  Filled 2019-07-06: qty 1

## 2019-07-06 MED ORDER — ENOXAPARIN SODIUM 40 MG/0.4ML ~~LOC~~ SOLN
40.0000 mg | SUBCUTANEOUS | Status: DC
  Administered 2019-07-06: 40 mg via SUBCUTANEOUS
  Filled 2019-07-06: qty 0.4

## 2019-07-06 MED ORDER — ONDANSETRON HCL 4 MG/2ML IJ SOLN: 4.0000 mg | Freq: Four times a day (QID) | INTRAMUSCULAR | Status: DC | PRN

## 2019-07-06 MED ORDER — SODIUM CHLORIDE 0.9% FLUSH: 10.0000 mL | INTRAVENOUS | Status: DC | PRN

## 2019-07-06 MED ORDER — POTASSIUM CHLORIDE IN NACL 20-0.9 MEQ/L-% IV SOLN
INTRAVENOUS | Status: DC
  Filled 2019-07-06 (×7): qty 1000

## 2019-07-06 NOTE — H&P (Signed)
History and Physical    [name]:[id number] DOB: May 14, 1960 DOA: 07/06/2019  PCP: Patient, No Pcp Per    Patient coming from: police custody   Chief Complaint: Alcohol withdrawal delirium  HPI: Steven Gardner is a 59 y.o. male with medical history significant of alcohol abuse with multiple ED visits for alcohol withdrawal symptoms, hep C, alcoholic pancreatitis, cervical disc degeneration who was brought from custody with tremors and mumbling with confusion.  Patient has been in custody since yesterday afternoon.  He is sleepy but arousable to command during my evaluation.  Informs that his last drink was yesterday.  In the ED he was increasingly tremulous, tachycardic, agitated trying to climb out of bed.  Patient talking about seeing his grandmother to the nurse.    On questioning patient reports that he drinks 6 beers a day only and no illicit drug use.  Unable to answer further questions as he falls back asleep.    ED Course:  He was tachycardic in the 110s, normal blood pressure, O2 sat and afebrile.  CBC showed platelets of 103, normal chemistry except for blood glucose of 156, AST of 19 and ALT of 49, total bili of 1.7.  Lipase of 59 and undetectable alcohol level. Patient started on banana bag and received a total of 60 mg of IV Ativan. Hospitalist consulted for admission to medical floor on telemetry.  Alcohol withdrawal delirium.  COVID-19 PCR pending (was negative 62-month back)  Review of Systems: As per HPI.  Review of systems limited due to patient's delirium.    Past Medical History:  Diagnosis Date  . Hepatitis C   . Paresthesias   . Spinal stenosis   . Stroke [facility])     Past Surgical History:  Procedure Laterality Date  . ELBOW SURGERY    . ESOPHAGOGASTRODUODENOSCOPY (EGD) WITH PROPOFOL N/A 11/08/2017   Procedure: ESOPHAGOGASTRODUODENOSCOPY (EGD) WITH PROPOFOL;  Surgeon: Midge Minium, MD;  Location: [facility] ENDOSCOPY;  Service: Endoscopy;   Laterality: N/A;  . KNEE SURGERY    . NECK SURGERY    . SPINE SURGERY      Social History Reports drinking 6 beers a day.  Does not smoke or illicit drug use. No Known Allergies  Family History  Problem Relation Age of Onset  . Stroke Paternal Grandfather      Prior to Admission medications   Medication Sig Start Date End Date Taking? Authorizing Provider  Multiple Vitamin (MULTIVITAMIN WITH MINERALS) TABS tablet Take 1 tablet by mouth daily. Patient not taking: Reported on 02/01/2019 11/09/17   Salary, Jetty Duhamel D, MD  tamsulosin (FLOMAX) 0.4 MG CAPS capsule Take 1 capsule (0.4 mg total) by mouth daily. Patient not taking: Reported on 02/01/2019 08/03/17   Kerman Passey, MD    Physical Exam: Vitals:   07/06/19 1006 07/06/19 1334 07/06/19 1518 07/06/19 1519  BP:  (!) 165/82 133/73 133/73  Pulse:  (!) 117 95 95  Resp:  20 20   Temp:  98.3 F (36.8 C) 98.2 F (36.8 C)   TempSrc:  Oral Oral   SpO2:  98% 96%   Weight: 75 kg     Height: 5\' 11"  (1.803 m)       Constitutional: NAD, calm, comfortable Vitals:   07/06/19 1006 07/06/19 1334 07/06/19 1518 07/06/19 1519  BP:  (!) 165/82 133/73 133/73  Pulse:  (!) 117 95 95  Resp:  20 20   Temp:  98.3 F (36.8 C) 98.2 F (36.8 C)   TempSrc:  Oral Oral   SpO2:  98% 96%   Weight: 75 kg     Height: 5\' 11"  (1.803 m)      Eyes: Pupils reactive bilateral, normal extraocular muscles.  No pallor, no icterus ENMT: Moist mucosa, supple neck, no cervical lymphadenopathy Respiratory: clear to auscultation bilaterally, no wheezing, rhonchi Cardiovascular: S1 and S2 regular, no murmur rub or gallop  Abdomen: Soft, nondistended, nontender, bowel sounds present Musculoskeletal: Warm, no edema, normal skin Sleepy but arousable, answers few questions but falls back asleep, moving all extremities.  Tremors+ +     Labs on Admission: I have personally reviewed following labs and imaging studies  CBC: Recent Labs  Lab 07/06/19 1015  WBC  7.7  HGB 15.0  HCT 43.6  MCV 96.0  PLT 103*    Basic Metabolic Panel: Recent Labs  Lab 07/06/19 1015  NA 139  K 4.4  CL 101  CO2 25  GLUCOSE 156*  BUN 11  CREATININE 0.95  CALCIUM 9.2    GFR: Estimated Creatinine Clearance: 92.1 mL/min (by C-G formula based on SCr of 0.95 mg/dL).  Liver Function Tests: Recent Labs  Lab 07/06/19 1015  AST 90*  ALT 49*  ALKPHOS 64  BILITOT 1.7*  PROT 9.0*  ALBUMIN 4.8    Urine analysis:    Component Value Date/Time   COLORURINE STRAW (A) 03/01/2019 1353   APPEARANCEUR CLEAR (A) 03/01/2019 1353   APPEARANCEUR Clear 06/04/2013 2142   LABSPEC 1.003 (L) 03/01/2019 1353   LABSPEC 1.004 06/04/2013 2142   PHURINE 5.0 03/01/2019 1353   GLUCOSEU NEGATIVE 03/01/2019 1353   GLUCOSEU Negative 06/04/2013 2142   HGBUR NEGATIVE 03/01/2019 1353   BILIRUBINUR NEGATIVE 03/01/2019 1353   BILIRUBINUR Negative 06/04/2013 2142   KETONESUR NEGATIVE 03/01/2019 1353   PROTEINUR NEGATIVE 03/01/2019 1353   NITRITE NEGATIVE 03/01/2019 1353   LEUKOCYTESUR NEGATIVE 03/01/2019 1353   LEUKOCYTESUR Negative 06/04/2013 2142    Radiological Exams on Admission: No results found.  EKG: Pending  Assessment/Plan Principal Problem:   Alcohol withdrawal delirium (HCC) Admit to MedSurg on telemetry.  Initiated CIWA protocol with as needed IV Ativan.  Continue banana bag.  IV thiamine, folate and multivitamin. Seizure precaution.  Neurochecks.  High risk for going into full-blown DTs requiring Ativan/Precedex drip. Check magnesium, phosphorus.  IV fluids with potassium supplement.  IV PPI. TOC consulted.  Active Problems:   Hx of hepatitis C No history of treatment.  Chronic mild transaminitis     Benign prostatic hyperplasia with urinary frequency Resume Flomax once able to take p.o.  ?  History of diabetes mellitus Blood glucose in 150s.  Last A1c 2 years ago was 4.8.  Check A1c.  History of GERD IV PPI   DVT prophylaxis: Subcu Lovenox Code  Status:   Full code Family Communication:  None Disposition Plan:   Patient is from:  Police custody  Anticipated DC to:  Return to custody  Anticipated DC date:  6/12  Anticipated DC barriers: Acute alcohol withdrawal   Consults called:  None Admission status:  Inpatient  Severity of Illness: The appropriate patient status for this patient is INPATIENT. Inpatient status is judged to be reasonable and necessary in order to provide the required intensity of service to ensure the patient's safety. The patient's presenting symptoms, physical exam findings, and initial radiographic and laboratory data in the context of their chronic comorbidities is felt to place them at high risk for further clinical deterioration. Furthermore, it is not anticipated that the patient will  be medically stable for discharge from the hospital within 2 midnights of admission. The following factors support the patient status of inpatient.   " The patient's presenting symptoms include withdrawal with delirium " The worrisome physical exam findings include risk for delirium tremens, electrolyte abnormalities with arrhythmias " The initial radiographic and laboratory data are worrisome because of alcohol withdrawal " The chronic co-morbidities include alcoholic pancreatitis, chronic alcoholism   * I certify that at the point of admission it is my clinical judgment that the patient will require inpatient hospital care spanning beyond 2 midnights from the point of admission due to high intensity of service, high risk for further deterioration and high frequency of surveillance required.*     Jenna Routzahn MD [facility]  How to contact the [facility] Attending or Consulting provider [place] or covering provider during after hours [place], for this patient?   1. Check the care team in [facility] and look for a) attending/consulting [facility] provider listed and b) the [facility] Iv team listed 2. Log into [email or web address] and use [facility]'s universal password to access. If you do not have the password, please contact the hospital operator. 3. Locate the [facility] provider you are looking for under [facility] and page to a number that you can be directly reached. 4. If you still have difficulty reaching the provider, please page the [facility] (Director on Call) for the Hospitalists listed on amion for assistance.  07/06/2019, 3:44 PM

## 2019-07-06 NOTE — ED Notes (Signed)
Pt was given dinner tray.  

## 2019-07-06 NOTE — ED Notes (Signed)
Patient given more Ativan due to agitation and attempting to climb out of bed and crawl on the floor. Patient is also talking about seeing his grandmother he keeps talking about her. Patient wont keep his BP cuff on or his pulse oximeter on, he has been tachycardic. MD notified.

## 2019-07-06 NOTE — ED Notes (Signed)
Patient is in bed resting eyes closed and appears much calmer, Dr. Gonzella Lex is in the room assessing patient

## 2019-07-06 NOTE — ED Provider Notes (Signed)
[facility] Emergency Department Provider Note   ____________________________________________   First MD Initiated Contact with Patient 07/06/19 1203     (approximate)  I have reviewed the triage vital signs and the nursing notes.   HISTORY  Chief Complaint Nausea    HPI Steven Gardner is a 59 y.o. male patient complaining of alcohol withdrawal.  Initially he was confused and a little bit combative.  This improved with some Ativan.  He last drank 2 days ago.  He is still very shaky and tachycardic.  Is very antsy and has trouble staying still as well.  He has a history of hepatitis C pancreatitis, spinal stenosis diabetes stroke and others please see below        Past Medical History:  Diagnosis Date  . Hepatitis C   . Paresthesias   . Spinal stenosis   . Stroke [facility])     Patient Active Problem List   Diagnosis Date Noted  . Alcohol abuse   . Alcohol induced acute pancreatitis 11/29/2017  . Ileus ([place]) 11/29/2017  . Alcohol-induced pancreatitis 11/29/2017  . Acute gastritis without hemorrhage   . Hematemesis 11/07/2017  . Benign prostatic hyperplasia with urinary frequency 08/12/2017  . Type 2 diabetes mellitus ([place]) 08/03/2017  . Hx of hepatitis C 08/03/2017  . Chronic lower back pain 08/03/2017  . Cervical radiculopathy at C6 08/03/2017    Past Surgical History:  Procedure Laterality Date  . ELBOW SURGERY    . ESOPHAGOGASTRODUODENOSCOPY (EGD) WITH PROPOFOL N/A 11/08/2017   Procedure: ESOPHAGOGASTRODUODENOSCOPY (EGD) WITH PROPOFOL;  Surgeon: Lucilla Lame, MD;  Location: [facility] ENDOSCOPY;  Service: Endoscopy;  Laterality: N/A;  . KNEE SURGERY    . NECK SURGERY    . SPINE SURGERY      Prior to Admission medications   Medication Sig Start Date End Date Taking? Authorizing Provider  Multiple Vitamin (MULTIVITAMIN WITH MINERALS) TABS tablet Take 1 tablet by mouth daily. Patient not taking: Reported on 02/01/2019 11/09/17    Salary, Holly Bodily D, MD  tamsulosin (FLOMAX) 0.4 MG CAPS capsule Take 1 capsule (0.4 mg total) by mouth daily. Patient not taking: Reported on 02/01/2019 08/03/17   Arnetha Courser, MD    Allergies Patient has no known allergies.  Family History  Problem Relation Age of Onset  . Stroke Paternal Grandfather     Social History Social History   Tobacco Use  . Smoking status: Former Research scientist (life sciences)  . Smokeless tobacco: Never Used  Vaping Use  . Vaping Use: Never used  Substance Use Topics  . Alcohol use: Yes    Comment: 8-10 40oz/beer daily  . Drug use: No    Review of Systems  Constitutional: No fever/chills Eyes: No visual changes. ENT: No sore throat. Cardiovascular: Denies chest pain. Respiratory: Denies shortness of breath. Gastrointestinal: No abdominal pain.  No nausea, no vomiting.  No diarrhea.  No constipation. Genitourinary: Negative for dysuria. Musculoskeletal: Negative for back pain. Skin: Negative for rash. Neurological: Negative for headaches, focal weakness   ____________________________________________   PHYSICAL EXAM:  VITAL SIGNS: ED Triage Vitals  Enc Vitals Group     BP 07/06/19 1005 (!) 157/100     Pulse Rate 07/06/19 1005 100     Resp 07/06/19 1005 20     Temp 07/06/19 1005 99.2 F (37.3 C)     Temp Source 07/06/19 1005 Oral     SpO2 07/06/19 1005 96 %     Weight 07/06/19 1006 165 lb 5.5 oz (75 kg)  Height 07/06/19 1006 5\' 11"  (1.803 m)     Head Circumference --      Peak Flow --      Pain Score 07/06/19 1006 7     Pain Loc --      Pain Edu? --      Excl. in GC? --     Constitutional: Alert and oriented. Well appearing and in no acute distress. Eyes: Conjunctivae are normal. PER. EOMI. Head: Atraumatic. Nose: No congestion/rhinnorhea. Mouth/Throat: Mucous membranes are moist.  Oropharynx non-erythematous. Neck: No stridor.   Cardiovascular: Rapid rate, regular rhythm. Grossly normal heart sounds.  Good peripheral  circulation. Respiratory: Normal respiratory effort.  No retractions. Lungs CTAB. Gastrointestinal: Soft and nontender. No distention. No abdominal bruits. Musculoskeletal: No lower extremity tenderness nor edema.  No joint effusions. Neurologic:  Normal speech and language. No gross focal neurologic deficits are appreciated.  Skin:  Skin is warm, dry and intact. No rash noted.  ____________________________________________   LABS (all labs ordered are listed, but only abnormal results are displayed)  Labs Reviewed  COMPREHENSIVE METABOLIC PANEL - Abnormal; Notable for the following components:      Result Value   Glucose, Bld 156 (*)    Total Protein 9.0 (*)    AST 90 (*)    ALT 49 (*)    Total Bilirubin 1.7 (*)    All other components within normal limits  CBC - Abnormal; Notable for the following components:   Platelets 103 (*)    All other components within normal limits  LIPASE, BLOOD - Abnormal; Notable for the following components:   Lipase 59 (*)    All other components within normal limits  SARS CORONAVIRUS 2 BY RT PCR (HOSPITAL ORDER, PERFORMED IN [facility])  ETHANOL  URINE DRUG SCREEN, QUALITATIVE ([facility] ONLY)  URINALYSIS, COMPLETE (UACMP) WITH MICROSCOPIC  ACETAMINOPHEN LEVEL  SALICYLATE LEVEL   ____________________________________________  EKG   ____________________________________________  RADIOLOGY  ED MD interpretation:    Official radiology report(s): No results found.  ____________________________________________   PROCEDURES  Procedure(s) performed (including Critical Care):  Procedures   ____________________________________________   INITIAL IMPRESSION / ASSESSMENT AND PLAN / ED COURSE     Patient with apparent alcohol withdrawal.  I have given him several doses of 4 mg of Ativan and this has not done anything except for keep him from getting confused again.  He is still tachycardic up in the 120s to 130s and very antsy  and cannot really sit still for very long.  He is constantly occluding his IV with his bag of thiamine and multivitamins and folate attacks.  We will get him in the hospital for further treatment.         ____________________________________________   FINAL CLINICAL IMPRESSION(S) / ED DIAGNOSES  Final diagnoses:  Alcohol withdrawal syndrome without complication [facility])     ED Discharge Orders    None       Note:  This document was prepared using Dragon voice recognition software and may include unintentional dictation errors.    [facility], MD 07/06/19 1600

## 2019-07-06 NOTE — ED Notes (Signed)
Attempted twice to draw labs on patient, myself and EDT unsuccessful, placed a call to lab for assistance

## 2019-07-06 NOTE — ED Notes (Signed)
Pt BIB [place] [facility] with c/o alcohol withdrawals. Pt shaking and mumbling at this time. Report that pt has been talking out of his head. Deputy received pt yesterday

## 2019-07-07 ENCOUNTER — Encounter: Payer: Self-pay | Admitting: Internal Medicine

## 2019-07-07 DIAGNOSIS — D696 Thrombocytopenia, unspecified: Secondary | ICD-10-CM

## 2019-07-07 DIAGNOSIS — F10231 Alcohol dependence with withdrawal delirium: Principal | ICD-10-CM

## 2019-07-07 LAB — COMPREHENSIVE METABOLIC PANEL
ALT: 35 U/L (ref 0–44)
AST: 55 U/L — ABNORMAL HIGH (ref 15–41)
Albumin: 4 g/dL (ref 3.5–5.0)
Alkaline Phosphatase: 47 U/L (ref 38–126)
Anion gap: 7 (ref 5–15)
BUN: 15 mg/dL (ref 6–20)
CO2: 26 mmol/L (ref 22–32)
Calcium: 8.6 mg/dL — ABNORMAL LOW (ref 8.9–10.3)
Chloride: 107 mmol/L (ref 98–111)
Creatinine, Ser: 0.94 mg/dL (ref 0.61–1.24)
GFR calc Af Amer: >60 mL/min (ref >60)
GFR calc non Af Amer: >60 mL/min (ref >60)
Glucose, Bld: 94 mg/dL (ref 70–99)
Potassium: 3.7 mmol/L (ref 3.5–5.1)
Sodium: 140 mmol/L (ref 135–145)
Total Bilirubin: 1.7 mg/dL — ABNORMAL HIGH (ref 0.3–1.2)
Total Protein: 7.2 g/dL (ref 6.5–8.1)

## 2019-07-07 LAB — CBC
HCT: 40.7 % (ref 39.0–52.0)
Hemoglobin: 14.3 g/dL (ref 13.0–17.0)
MCH: 33.6 pg (ref 26.0–34.0)
MCHC: 35.1 g/dL (ref 30.0–36.0)
MCV: 95.5 fL (ref 80.0–100.0)
Platelets: 74 10*3/uL — ABNORMAL LOW (ref 150–400)
RBC: 4.26 MIL/uL (ref 4.22–5.81)
RDW: 13.1 % (ref 11.5–15.5)
WBC: 4.9 10*3/uL (ref 4.0–10.5)
nRBC: 0 % (ref 0.0–0.2)

## 2019-07-07 LAB — GLUCOSE, CAPILLARY: Glucose-Capillary: 89 mg/dL (ref 70–99)

## 2019-07-07 LAB — HIV ANTIBODY (ROUTINE TESTING W REFLEX): HIV Screen 4th Generation wRfx: NONREACTIVE

## 2019-07-07 NOTE — Progress Notes (Signed)
PROGRESS NOTE    Steven Gardner  ZOX:[id number] DOB: October 23, 1960 DOA: 07/06/2019 PCP: Patient, No Pcp Per   Chief Complaint  Patient presents with  . Nausea    Brief Narrative:  59 year old male with significant alcohol abuse with multiple ED visits for alcohol withdrawal symptoms, hep C (not treated), history of alcoholic pancreatitis, cervical disc degeneration brought from custody with the tremors and increased confusion.  Patient was in active alcohol withdrawal in the ED requiring almost 60 mg of Ativan and admitted to hospitalist service.  Assessment & Plan:   Principal Problem:   Alcohol withdrawal delirium (HCC) Still quite tremulous and unsteady when walking to the bathroom.  Denies any visual or auditory hallucinations.  Reports having abdominal cramping.  Denies nausea, vomiting, chest pain or shortness of breath.  CIWA of 13 this morning. Continue Ativan as needed, thiamine, folate and multivitamin.  Counseled strongly on alcohol cessation.  TOC consulted. Tolerating diet.  Continue IV fluids.  Active Problems: Thrombocytopenia Secondary to alcohol use.  Monitor for now.    Hx of hepatitis C Never treated.  Mild transaminitis.  Monitor    Benign prostatic hyperplasia with urinary frequency Continue Flomax.     DVT prophylaxis: SCDs Code Status: Full code Family Communication: None Disposition:   Status is: Inpatient  Remains inpatient appropriate because:Hemodynamically unstable   Dispo: The patient is from: Custody              Anticipated d/c is to: Return to police custody              Anticipated d/c date is: 2 days              Patient currently is not medically stable to d/c.       Consultants:   None   Procedures: None  Antimicrobials: None     Subjective: Seen and examined.  Reports having abdominal cramps.  Unsteady when ambulating to the bathroom  Objective: Vitals:   07/06/19 1936 07/06/19 2115 07/07/19 0019  07/07/19 0406  BP: (!) 152/90 (!) 145/84 113/67 140/77  Pulse: 86 96 77 70  Resp: 17 (!) 21 20   Temp: 98.6 F (37 C) 98.2 F (36.8 C) 98.6 F (37 C) 98.1 F (36.7 C)  TempSrc: Oral Oral Oral Oral  SpO2: 96% 97% 96% 99%  Weight:      Height:        Intake/Output Summary (Last 24 hours) at 07/07/2019 1103 Last data filed at 07/07/2019 1000 Gross per 24 hour  Intake 1691.75 ml  Output 375 ml  Net 1316.75 ml   Filed Weights   07/06/19 1006  Weight: 75 kg    Examination:  General: Middle-aged male appears fatigued HEENT: Moist mucosa, supple neck Chest: Clear bilaterally CVs: Normal S1-S2, no murmurs or gallop GI: Soft, nondistended, mild lower abdominal tenderness, bowel sounds present Musculoskeletal: Warm, no edema CNS: Alert and oriented, tremulous, nonfocal    Data Reviewed: I have personally reviewed following labs and imaging studies  CBC: Recent Labs  Lab 07/06/19 1015 07/06/19 1627 07/07/19 0532  WBC 7.7 6.6 4.9  HGB 15.0 13.6 14.3  HCT 43.6 39.5 40.7  MCV 96.0 97.3 95.5  PLT 103* 78* 74*    Basic Metabolic Panel: Recent Labs  Lab 07/06/19 1015 07/06/19 1627 07/07/19 0532  NA 139 136 140  K 4.4 4.1 3.7  CL 101 102 107  CO2 25 26 26   GLUCOSE 156* 95 94  BUN 11 11 15  CREATININE 0.95 0.92 0.94  CALCIUM 9.2 8.6* 8.6*  MG  --  2.3  --   PHOS  --  2.7  --     GFR: Estimated Creatinine Clearance: 93.1 mL/min (by C-G formula based on SCr of 0.94 mg/dL).  Liver Function Tests: Recent Labs  Lab 07/06/19 1015 07/06/19 1627 07/07/19 0532  AST 90* 68* 55*  ALT 49* 39 35  ALKPHOS 64 50 47  BILITOT 1.7* 1.8* 1.7*  PROT 9.0* 7.7 7.2  ALBUMIN 4.8 4.2 4.0    CBG: Recent Labs  Lab 07/07/19 0752  GLUCAP 89     Recent Results (from the past 240 hour(s))  SARS Coronavirus 2 by RT PCR (hospital order, performed in [facility] hospital lab) Nasopharyngeal Nasopharyngeal Swab     Status: None   Collection Time: 07/06/19  4:27 PM    Specimen: Nasopharyngeal Swab  Result Value Ref Range Status   SARS Coronavirus 2 NEGATIVE NEGATIVE Final    Comment: (NOTE) SARS-CoV-2 target nucleic acids are NOT DETECTED.  The SARS-CoV-2 RNA is generally detectable in upper and lower respiratory specimens during the acute phase of infection. The lowest concentration of SARS-CoV-2 viral copies this assay can detect is 250 copies / mL. A negative result does not preclude SARS-CoV-2 infection and should not be used as the sole basis for treatment or other patient management decisions.  A negative result may occur with improper specimen collection / handling, submission of specimen other than nasopharyngeal swab, presence of viral mutation(s) within the areas targeted by this assay, and inadequate number of viral copies (<250 copies / mL). A negative result must be combined with clinical observations, patient history, and epidemiological information.  Fact Sheet for Patients:   BoilerBrush.com.cy  Fact Sheet for Healthcare Providers: https://pope.com/  This test is not yet approved or  cleared by the [place] FDA and has been authorized for detection and/or diagnosis of SARS-CoV-2 by FDA under an Emergency Use Authorization (EUA).  This EUA will remain in effect (meaning this test can be used) for the duration of the COVID-19 declaration under Section 564(b)(1) of the Act, 21 U.S.C. section 360bbb-3(b)(1), unless the authorization is terminated or revoked sooner.  Performed at [facility], [address] [place], [place] [address]          Radiology Studies: DG Chest [place] 1 View  Result Date: 07/06/2019 CLINICAL DATA:  Alcohol withdrawal EXAM: PORTABLE CHEST 1 VIEW COMPARISON:  03/03/2019 FINDINGS: Cardiac silhouette appears mildly enlarged, although this is likely accentuated by low lung volumes and portable AP technique. No focal airspace consolidation,  pleural effusion, or pneumothorax. Lower cervical fusion hardware. IMPRESSION: No active disease. Electronically Signed   By: Duanne Guess D.O.   On: 07/06/2019 16:40        Scheduled Meds: . enoxaparin (LOVENOX) injection  40 mg Subcutaneous Q24H  . folic acid  1 mg Oral Daily  . LORazepam  0-4 mg Intravenous Q6H   Or  . LORazepam  0-4 mg Oral Q6H  . [START ON 07/08/2019] LORazepam  0-4 mg Intravenous Q12H   Or  . [START ON 07/08/2019] LORazepam  0-4 mg Oral Q12H  . multivitamin with minerals  1 tablet Oral Daily  . pantoprazole (PROTONIX) IV  40 mg Intravenous Q24H  . sodium chloride flush  10-40 mL Intracatheter Q12H  . thiamine  100 mg Oral Daily   Or  . thiamine  100 mg Intravenous Daily   Continuous Infusions: . 0.9 % NaCl  with KCl 20 mEq / L 100 mL/hr at 07/07/19 1000     LOS: 1 day    Time spent: 35 minutes    Ishaq Maffei, MD [facility]   To contact the attending provider between 7A-7P or the covering provider during after hours 7P-7A, please log into the web site [email or web address] and access using universal [place] password for that web site. If you do not have the password, please call the hospital operator.  07/07/2019, 11:03 AM

## 2019-07-07 NOTE — Plan of Care (Signed)
Continuing with plan of care. 

## 2019-07-08 LAB — GLUCOSE, CAPILLARY: Glucose-Capillary: 97 mg/dL (ref 70–99)

## 2019-07-08 LAB — CBC
HCT: 41 % (ref 39.0–52.0)
Hemoglobin: 14.2 g/dL (ref 13.0–17.0)
MCH: 33.3 pg (ref 26.0–34.0)
MCHC: 34.6 g/dL (ref 30.0–36.0)
MCV: 96 fL (ref 80.0–100.0)
Platelets: 70 10*3/uL — ABNORMAL LOW (ref 150–400)
RBC: 4.27 MIL/uL (ref 4.22–5.81)
RDW: 12.8 % (ref 11.5–15.5)
WBC: 4.6 10*3/uL (ref 4.0–10.5)
nRBC: 0 % (ref 0.0–0.2)

## 2019-07-08 LAB — COMPREHENSIVE METABOLIC PANEL
ALT: 35 U/L (ref 0–44)
AST: 50 U/L — ABNORMAL HIGH (ref 15–41)
Albumin: 3.9 g/dL (ref 3.5–5.0)
Alkaline Phosphatase: 49 U/L (ref 38–126)
Anion gap: 11 (ref 5–15)
BUN: 13 mg/dL (ref 6–20)
CO2: 24 mmol/L (ref 22–32)
Calcium: 8.9 mg/dL (ref 8.9–10.3)
Chloride: 104 mmol/L (ref 98–111)
Creatinine, Ser: 0.83 mg/dL (ref 0.61–1.24)
GFR calc Af Amer: >60 mL/min (ref >60)
GFR calc non Af Amer: >60 mL/min (ref >60)
Glucose, Bld: 93 mg/dL (ref 70–99)
Potassium: 3.7 mmol/L (ref 3.5–5.1)
Sodium: 139 mmol/L (ref 135–145)
Total Bilirubin: 0.9 mg/dL (ref 0.3–1.2)
Total Protein: 7.1 g/dL (ref 6.5–8.1)

## 2019-07-08 NOTE — Progress Notes (Signed)
PROGRESS NOTE    Steven Gardner  JOA:[id number] DOB: May 11, 1960 DOA: 07/06/2019 PCP: Patient, No Pcp Per   Chief Complaint  Patient presents with  . Nausea    Brief Narrative:  59 year old male with significant alcohol abuse with multiple ED visits for alcohol withdrawal symptoms, hep C (not treated), history of alcoholic pancreatitis, cervical disc degeneration brought from custody with the tremors and increased confusion.  Patient was in active alcohol withdrawal in the ED requiring almost 60 mg of Ativan and admitted to hospitalist service.  Assessment & Plan:   Principal Problem:   Alcohol withdrawal delirium (HCC) Patient still quite tremulous and reports nausea with abdominal cramping.  CIWA per nurse was 12 earlier this morning. Continue Ativan as needed, thiamine, folate and multivitamin.  Zofran as needed for nausea.  Counseled again on alcohol cessation. Tolerating diet.  Active Problems: Thrombocytopenia Secondary to alcohol use.  Monitor for now.    Hx of hepatitis C Never treated.  Mild transaminitis.  Monitor    Benign prostatic hyperplasia with urinary frequency Continue Flomax.  Possibly discharge to custody tomorrow if withdrawal symptoms improved.   DVT prophylaxis: SCDs Code Status: Full code Family Communication: None Disposition:   Status is: Inpatient  Remains inpatient appropriate because:Hemodynamically unstable still having active alcohol withdrawal symptoms.  Possible discharge tomorrow if   Dispo: The patient is from: Custody              Anticipated d/c is to: Return to police custody              Anticipated d/c date is: 1 day              Patient currently is not medically stable to d/c.       Consultants:   None   Procedures: None  Antimicrobials: None     Subjective: Seen and examined.  Still complaining of off-and-on abdominal cramps and nausea.  No vomiting.  Still tremulous.  Objective: Vitals:    07/07/19 1130 07/07/19 1921 07/08/19 0429 07/08/19 1207  BP: 134/83 (!) 89/52 (!) 153/87 (!) 135/93  Pulse: 73 82 67 75  Resp:  19 20 16   Temp: 98.2 F (36.8 C) 98.6 F (37 C) 97.6 F (36.4 C) 98.5 F (36.9 C)  TempSrc: Oral Oral Oral Oral  SpO2: 100% 97% 96% 97%  Weight:      Height:        Intake/Output Summary (Last 24 hours) at 07/08/2019 1233 Last data filed at 07/08/2019 1100 Gross per 24 hour  Intake 2726.31 ml  Output 800 ml  Net 1926.31 ml   Filed Weights   07/06/19 1006  Weight: 75 kg   Physical exam Appears fatigued HEENT: Moist mucosa, supple neck Chest: Clear CVs: Normal S1-S2 GI: Nondistended, nontender Musculoskeletal: Warm, no edema CNS: Alert and oriented, tremors ++     Data Reviewed: I have personally reviewed following labs and imaging studies  CBC: Recent Labs  Lab 07/06/19 1015 07/06/19 1627 07/07/19 0532 07/08/19 0550  WBC 7.7 6.6 4.9 4.6  HGB 15.0 13.6 14.3 14.2  HCT 43.6 39.5 40.7 41.0  MCV 96.0 97.3 95.5 96.0  PLT 103* 78* 74* 70*    Basic Metabolic Panel: Recent Labs  Lab 07/06/19 1015 07/06/19 1627 07/07/19 0532 07/08/19 0550  NA 139 136 140 139  K 4.4 4.1 3.7 3.7  CL 101 102 107 104  CO2 25 26 26 24   GLUCOSE 156* 95 94 93  BUN 11 11 15  13  CREATININE 0.95 0.92 0.94 0.83  CALCIUM 9.2 8.6* 8.6* 8.9  MG  --  2.3  --   --   PHOS  --  2.7  --   --     GFR: Estimated Creatinine Clearance: 105.4 mL/min (by C-G formula based on SCr of 0.83 mg/dL).  Liver Function Tests: Recent Labs  Lab 07/06/19 1015 07/06/19 1627 07/07/19 0532 07/08/19 0550  AST 90* 68* 55* 50*  ALT 49* 39 35 35  ALKPHOS 64 50 47 49  BILITOT 1.7* 1.8* 1.7* 0.9  PROT 9.0* 7.7 7.2 7.1  ALBUMIN 4.8 4.2 4.0 3.9    CBG: Recent Labs  Lab 07/07/19 0752 07/08/19 0756  GLUCAP 89 97     Recent Results (from the past 240 hour(s))  SARS Coronavirus 2 by RT PCR (hospital order, performed in [facility] hospital lab) Nasopharyngeal  Nasopharyngeal Swab     Status: None   Collection Time: 07/06/19  4:27 PM   Specimen: Nasopharyngeal Swab  Result Value Ref Range Status   SARS Coronavirus 2 NEGATIVE NEGATIVE Final    Comment: (NOTE) SARS-CoV-2 target nucleic acids are NOT DETECTED.  The SARS-CoV-2 RNA is generally detectable in upper and lower respiratory specimens during the acute phase of infection. The lowest concentration of SARS-CoV-2 viral copies this assay can detect is 250 copies / mL. A negative result does not preclude SARS-CoV-2 infection and should not be used as the sole basis for treatment or other patient management decisions.  A negative result may occur with improper specimen collection / handling, submission of specimen other than nasopharyngeal swab, presence of viral mutation(s) within the areas targeted by this assay, and inadequate number of viral copies (<250 copies / mL). A negative result must be combined with clinical observations, patient history, and epidemiological information.  Fact Sheet for Patients:   BoilerBrush.com.cy  Fact Sheet for Healthcare Providers: https://pope.com/  This test is not yet approved or  cleared by the [place] FDA and has been authorized for detection and/or diagnosis of SARS-CoV-2 by FDA under an Emergency Use Authorization (EUA).  This EUA will remain in effect (meaning this test can be used) for the duration of the COVID-19 declaration under Section 564(b)(1) of the Act, 21 U.S.C. section 360bbb-3(b)(1), unless the authorization is terminated or revoked sooner.  Performed at [facility], [address] [place], [place] [address]          Radiology Studies: DG Chest [place] 1 View  Result Date: 07/06/2019 CLINICAL DATA:  Alcohol withdrawal EXAM: PORTABLE CHEST 1 VIEW COMPARISON:  03/03/2019 FINDINGS: Cardiac silhouette appears mildly enlarged, although this is likely accentuated by low  lung volumes and portable AP technique. No focal airspace consolidation, pleural effusion, or pneumothorax. Lower cervical fusion hardware. IMPRESSION: No active disease. Electronically Signed   By: Duanne Guess D.O.   On: 07/06/2019 16:40        Scheduled Meds: . folic acid  1 mg Oral Daily  . LORazepam  0-4 mg Intravenous Q12H   Or  . LORazepam  0-4 mg Oral Q12H  . multivitamin with minerals  1 tablet Oral Daily  . pantoprazole (PROTONIX) IV  40 mg Intravenous Q24H  . sodium chloride flush  10-40 mL Intracatheter Q12H  . thiamine  100 mg Oral Daily   Or  . thiamine  100 mg Intravenous Daily   Continuous Infusions: . 0.9 % NaCl with KCl 20 mEq / L 100 mL/hr at 07/08/19 1100  LOS: 2 days    Time spent: 25 minutes    Ginia Rudell, MD [facility]   To contact the attending provider between 7A-7P or the covering provider during after hours 7P-7A, please log into the web site [email or web address] and access using universal [place] password for that web site. If you do not have the password, please call the hospital operator.  07/08/2019, 12:33 PM

## 2019-07-08 NOTE — Plan of Care (Signed)
Continuing with plan of care. 

## 2019-07-09 DIAGNOSIS — D696 Thrombocytopenia, unspecified: Secondary | ICD-10-CM | POA: Diagnosis present

## 2019-07-09 DIAGNOSIS — F10931 Alcohol use, unspecified with withdrawal delirium: Secondary | ICD-10-CM

## 2019-07-09 LAB — GLUCOSE, CAPILLARY: Glucose-Capillary: 119 mg/dL — ABNORMAL HIGH (ref 70–99)

## 2019-07-09 MED ORDER — FAMOTIDINE 20 MG PO TABS: 20.0000 mg | ORAL_TABLET | Freq: Two times a day (BID) | ORAL | 0 refills | Status: DC

## 2019-07-09 MED ORDER — ADULT MULTIVITAMIN W/MINERALS CH: 1.0000 | ORAL_TABLET | Freq: Every day | ORAL | 0 refills | Status: AC

## 2019-07-09 MED ORDER — FOLIC ACID 1 MG PO TABS: 1.0000 mg | ORAL_TABLET | Freq: Every day | ORAL | 0 refills | Status: AC

## 2019-07-09 MED ORDER — TAMSULOSIN HCL 0.4 MG PO CAPS: 0.4000 mg | ORAL_CAPSULE | Freq: Every day | ORAL | 0 refills | Status: AC

## 2019-07-09 MED ORDER — THIAMINE HCL 100 MG PO TABS: 100.0000 mg | ORAL_TABLET | Freq: Every day | ORAL | 0 refills | Status: AC

## 2019-07-09 MED ORDER — ONDANSETRON HCL 4 MG PO TABS: 4.0000 mg | ORAL_TABLET | Freq: Three times a day (TID) | ORAL | 0 refills | Status: AC | PRN

## 2019-07-09 MED ORDER — LORAZEPAM 2 MG PO TABS: 2.0000 mg | ORAL_TABLET | Freq: Three times a day (TID) | ORAL | 0 refills | Status: AC | PRN

## 2019-07-09 MED ORDER — PANTOPRAZOLE SODIUM 40 MG PO TBEC: 40.0000 mg | DELAYED_RELEASE_TABLET | Freq: Every day | ORAL | 0 refills | Status: AC

## 2019-07-09 NOTE — Discharge Instructions (Signed)
Alcohol Withdrawal Syndrome When a person who drinks a lot of alcohol stops drinking, he or she may have unpleasant and serious symptoms. These symptoms are called alcohol withdrawal syndrome. This condition may be mild or severe. It can be life-threatening. It can cause:  Shaking that you cannot control (tremor).  Sweating.  Headache.  Feeling fearful, upset, grouchy, or depressed.  Trouble sleeping (insomnia).  Nightmares.  Fast or uneven heartbeats (palpitations).  Alcohol cravings.  Feeling sick to your stomach (nausea).  Throwing up (vomiting).  Being bothered by light and sounds.  Confusion.  Trouble thinking clearly.  Not being hungry (loss of appetite).  Big changes in mood (mood swings). If you have all of the following symptoms at the same time, get help right away:  High blood pressure.  Fast heartbeat.  Trouble breathing.  Seizures.  Seeing, hearing, feeling, smelling, or tasting things that are not there (hallucinations). These symptoms are known as delirium tremens (DTs). They must be treated at the hospital right away. Follow these instructions at home:   Take over-the-counter and prescription medicines only as told by your doctor. This includes vitamins.  Do not drink alcohol.  Do not drive until your doctor says that this is safe for you.  Have someone stay with you or be available in case you need help. This should be someone you trust. This person can help you with your symptoms. He or she can also help you to not drink.  Drink enough fluid to keep your pee (urine) pale yellow.  Think about joining a support group or a treatment program to help you stop drinking.  Keep all follow-up visits as told by your doctor. This is important. Contact a doctor if:  Your symptoms get worse.  You cannot eat or drink without throwing up.  You have a hard time not drinking alcohol.  You cannot stop drinking alcohol. Get help right away  if:  You have fast or uneven heartbeats.  You have chest pain.  You have trouble breathing.  You have a seizure for the first time.  You see, hear, feel, smell, or taste something that is not there.  You get very confused. Summary  When a person who drinks a lot of alcohol stops drinking, he or she may have serious symptoms. This is called alcohol withdrawal syndrome.  Delirium tremens (DTs) is a group of life-threatening symptoms. You should get help right away if you have these symptoms.  Think about joining an alcohol support group or a treatment program. This information is not intended to replace advice given to you by your health care provider. Make sure you discuss any questions you have with your health care provider. Document Revised: 12/25/2016 Document Reviewed: 09/18/2016 Elsevier Patient Education  2020 [employer]  

## 2019-07-09 NOTE — Plan of Care (Signed)
Report given to nurse at [facility].  Patient is in stable condition.  Paper work to be given to Consulting civil engineer.

## 2019-07-09 NOTE — Plan of Care (Signed)
Continuing with plan of care. 

## 2019-07-09 NOTE — Discharge Summary (Signed)
Physician Discharge Summary  Steven Gardner NID:[id number] DOB: 03-05-1960 DOA: 07/06/2019  PCP: Steven Gardner, No Pcp Per  Admit date: 07/06/2019 Discharge date: 07/09/2019  Admitted From: [place] [facility] Disposition: Return to present  Recommendations for Outpatient Follow-up:  1. Follow up with follow-up with MD at custody as needed. 2.    Equipment/Devices: None  Discharge Condition: Fair CODE STATUS: Full code Diet recommendation: Regular   Discharge Diagnoses:  Principal problem Alcohol withdrawal delirium ([place])  Active Problems:   Hx of hepatitis C   Benign prostatic hyperplasia with urinary frequency   Alcohol abuse   Alcohol intoxication with delirium ([place])   Thrombocytopenia ([place])  Brief narrative/HPI 59 year old male with significant alcohol abuse with multiple ED visits for alcohol withdrawal symptoms, hep C (not treated), history of alcoholic pancreatitis, cervical disc degeneration brought from custody with the tremors and increased confusion.  Steven Gardner was in active alcohol withdrawal in the ED requiring almost 60 mg of Ativan and admitted to hospitalist service.  Hospital course  Principal Problem:   Alcohol withdrawal delirium ([facility]) Improving with minimal tremors.  CIWA of 2.  Complains of some nausea.  Abdominal cramping better. We will discharge on oral Ativan as needed for tremors and withdrawal symptoms.  Prescribed, thiamine, folate and multivitamin, Zofran for nausea and Protonix.   Active Problems: Thrombocytopenia Secondary to alcohol use.    Follow as outpatient    Hx of hepatitis C Never treated.  Mild transaminitis.  Monitor    Benign prostatic hyperplasia with urinary frequency Continue Flomax.  Procedure: None Consult: None Family communication: None   Discharge Instructions   Allergies as of 07/09/2019   No Known Allergies     Medication List    TAKE these medications   folic acid 1 MG tablet Commonly known as:  FOLVITE Take 1 tablet (1 mg total) by mouth daily. Start taking on: July 10, 2019   LORazepam 2 MG tablet Commonly known as: Ativan Take 1 tablet (2 mg total) by mouth every 8 (eight) hours as needed for anxiety (alcohol withdrawal symptoms ( tremots, sweeating, palpitations, )).   multivitamin with minerals Tabs tablet Take 1 tablet by mouth daily.   ondansetron 4 MG tablet Commonly known as: Zofran Take 1 tablet (4 mg total) by mouth every 8 (eight) hours as needed for nausea or vomiting.   pantoprazole 40 MG tablet Commonly known as: Protonix Take 1 tablet (40 mg total) by mouth daily.   tamsulosin 0.4 MG Caps capsule Commonly known as: FLOMAX Take 1 capsule (0.4 mg total) by mouth daily.   thiamine 100 MG tablet Take 1 tablet (100 mg total) by mouth daily. Start taking on: July 10, 2019       No Known Allergies    Procedures/Studies: DG Chest Port 1 View  Result Date: 07/06/2019 CLINICAL DATA:  Alcohol withdrawal EXAM: PORTABLE CHEST 1 VIEW COMPARISON:  03/03/2019 FINDINGS: Cardiac silhouette appears mildly enlarged, although this is likely accentuated by low lung volumes and portable AP technique. No focal airspace consolidation, pleural effusion, or pneumothorax. Lower cervical fusion hardware. IMPRESSION: No active disease. Electronically Signed   By: Davina Poke D.O.   On: 07/06/2019 16:40       Subjective: Examined.  Reports some nausea but no vomiting.  Abdominal cramping better.  Discharge Exam: Vitals:   07/08/19 1927 07/09/19 0432  BP: 122/82 128/82  Pulse: 90 92  Resp: 20 14  Temp: 98.3 F (36.8 C) 98.6 F (37 C)  SpO2: 96% 95%  Vitals:   07/08/19 0429 07/08/19 1207 07/08/19 1927 07/09/19 0432  BP: (!) 153/87 (!) 135/93 122/82 128/82  Pulse: 67 75 90 92  Resp: 20 16 20 14   Temp: 97.6 F (36.4 C) 98.5 F (36.9 C) 98.3 F (36.8 C) 98.6 F (37 C)  TempSrc: Oral Oral Oral Oral  SpO2: 96% 97% 96% 95%  Weight:      Height:         General: Fatigued, not in distress HEENT: Moist mucosa, supple neck Chest: Clear CVs: Normal S1-S2 GI: Soft, nondistended, nontender Musculoskeletal: Warm, no edema CNS: Alert oriented, mild tremors    The results of significant diagnostics from this hospitalization (including imaging, microbiology, ancillary and laboratory) are listed below for reference.     Microbiology: Recent Results (from the past 240 hour(s))  SARS Coronavirus 2 by RT PCR (hospital order, performed in [facility] hospital lab) Nasopharyngeal Nasopharyngeal Swab     Status: None   Collection Time: 07/06/19  4:27 PM   Specimen: Nasopharyngeal Swab  Result Value Ref Range Status   SARS Coronavirus 2 NEGATIVE NEGATIVE Final    Comment: (NOTE) SARS-CoV-2 target nucleic acids are NOT DETECTED.  The SARS-CoV-2 RNA is generally detectable in upper and lower respiratory specimens during the acute phase of infection. The lowest concentration of SARS-CoV-2 viral copies this assay can detect is 250 copies / mL. A negative result does not preclude SARS-CoV-2 infection and should not be used as the sole basis for treatment or other Steven Gardner management decisions.  A negative result may occur with improper specimen collection / handling, submission of specimen other than nasopharyngeal swab, presence of viral mutation(s) within the areas targeted by this assay, and inadequate number of viral copies (<250 copies / mL). A negative result must be combined with clinical observations, Steven Gardner history, and epidemiological information.  Fact Sheet for Patients:   09/05/19  Fact Sheet for Healthcare Providers: BoilerBrush.com.cy  This test is not yet approved or  cleared by the https://pope.com/ FDA and has been authorized for detection and/or diagnosis of SARS-CoV-2 by FDA under an Emergency Use Authorization (EUA).  This EUA will remain in effect (meaning this test  can be used) for the duration of the COVID-19 declaration under Section 564(b)(1) of the Act, 21 U.S.C. section 360bbb-3(b)(1), unless the authorization is terminated or revoked sooner.  Performed at [facility], [address] [place], [place]      Labs: BNP (last 3 results) No results for input(s): BNP in the last 8760 hours. Basic Metabolic Panel: Recent Labs  Lab 07/06/19 1015 07/06/19 1627 07/07/19 0532 07/08/19 0550  NA 139 136 140 139  K 4.4 4.1 3.7 3.7  CL 101 102 107 104  CO2 25 26 26 24   GLUCOSE 156* 95 94 93  BUN 11 11 15 13   CREATININE 0.95 0.92 0.94 0.83  CALCIUM 9.2 8.6* 8.6* 8.9  MG  --  2.3  --   --   PHOS  --  2.7  --   --    Liver Function Tests: Recent Labs  Lab 07/06/19 1015 07/06/19 1627 07/07/19 0532 07/08/19 0550  AST 90* 68* 55* 50*  ALT 49* 39 35 35  ALKPHOS 64 50 47 49  BILITOT 1.7* 1.8* 1.7* 0.9  PROT 9.0* 7.7 7.2 7.1  ALBUMIN 4.8 4.2 4.0 3.9   Recent Labs  Lab 07/06/19 1015  LIPASE 59*   No results for input(s): AMMONIA in the last 168 hours. CBC: Recent  Labs  Lab 07/06/19 1015 07/06/19 1627 07/07/19 0532 07/08/19 0550  WBC 7.7 6.6 4.9 4.6  HGB 15.0 13.6 14.3 14.2  HCT 43.6 39.5 40.7 41.0  MCV 96.0 97.3 95.5 96.0  PLT 103* 78* 74* 70*   Cardiac Enzymes: No results for input(s): CKTOTAL, CKMB, CKMBINDEX, TROPONINI in the last 168 hours. BNP: Invalid input(s): POCBNP CBG: Recent Labs  Lab 07/07/19 0752 07/08/19 0756 07/09/19 0739  GLUCAP 89 97 119*   D-Dimer No results for input(s): DDIMER in the last 72 hours. Hgb A1c No results for input(s): HGBA1C in the last 72 hours. Lipid Profile No results for input(s): CHOL, HDL, LDLCALC, TRIG, CHOLHDL, LDLDIRECT in the last 72 hours. Thyroid function studies No results for input(s): TSH, T4TOTAL, T3FREE, THYROIDAB in the last 72 hours.  Invalid input(s): FREET3 Anemia work up No results for input(s): VITAMINB12, FOLATE, FERRITIN, TIBC, IRON,  RETICCTPCT in the last 72 hours. Urinalysis    Component Value Date/Time   COLORURINE YELLOW (A) 07/06/2019 1627   APPEARANCEUR CLEAR (A) 07/06/2019 1627   APPEARANCEUR Clear 06/04/2013 2142   LABSPEC 1.019 07/06/2019 1627   LABSPEC 1.004 06/04/2013 2142   PHURINE 8.0 07/06/2019 1627   GLUCOSEU 150 (A) 07/06/2019 1627   GLUCOSEU Negative 06/04/2013 2142   HGBUR NEGATIVE 07/06/2019 1627   BILIRUBINUR NEGATIVE 07/06/2019 1627   BILIRUBINUR Negative 06/04/2013 2142   KETONESUR 20 (A) 07/06/2019 1627   PROTEINUR 30 (A) 07/06/2019 1627   NITRITE NEGATIVE 07/06/2019 1627   LEUKOCYTESUR NEGATIVE 07/06/2019 1627   LEUKOCYTESUR Negative 06/04/2013 2142   Sepsis Labs Invalid input(s): PROCALCITONIN,  WBC,  LACTICIDVEN Microbiology Recent Results (from the past 240 hour(s))  SARS Coronavirus 2 by RT PCR (hospital order, performed in [facility] hospital lab) Nasopharyngeal Nasopharyngeal Swab     Status: None   Collection Time: 07/06/19  4:27 PM   Specimen: Nasopharyngeal Swab  Result Value Ref Range Status   SARS Coronavirus 2 NEGATIVE NEGATIVE Final    Comment: (NOTE) SARS-CoV-2 target nucleic acids are NOT DETECTED.  The SARS-CoV-2 RNA is generally detectable in upper and lower respiratory specimens during the acute phase of infection. The lowest concentration of SARS-CoV-2 viral copies this assay can detect is 250 copies / mL. A negative result does not preclude SARS-CoV-2 infection and should not be used as the sole basis for treatment or other Steven Gardner management decisions.  A negative result may occur with improper specimen collection / handling, submission of specimen other than nasopharyngeal swab, presence of viral mutation(s) within the areas targeted by this assay, and inadequate number of viral copies (<250 copies / mL). A negative result must be combined with clinical observations, Steven Gardner history, and epidemiological information.  Fact Sheet for Patients:    BoilerBrush.com.cy  Fact Sheet for Healthcare Providers: https://pope.com/  This test is not yet approved or  cleared by the [place] FDA and has been authorized for detection and/or diagnosis of SARS-CoV-2 by FDA under an Emergency Use Authorization (EUA).  This EUA will remain in effect (meaning this test can be used) for the duration of the COVID-19 declaration under Section 564(b)(1) of the Act, 21 U.S.C. section 360bbb-3(b)(1), unless the authorization is terminated or revoked sooner.  Performed at [facility], [address], [place], [place] [address]      Time coordinating discharge: 35 minutes  SIGNED:   Eddie North, MD  [facility] 07/09/2019, 10:28 AM Pager   If 7PM-7AM, please contact night-coverage [email or web address] Password [email or web address]

## 2019-08-11 ENCOUNTER — Ambulatory Visit: Payer: Medicaid Other | Admitting: Adult Health

## 2020-10-17 IMAGING — CT CT HEAD W/O CM
3 series · 14 of 47 positions shown, 16 images · non-contrast
Comparison: Head CT 11/01/2018.

CLINICAL DATA: 58-year-old male with history of trauma from a fall.
Hematoma.

EXAM:
CT HEAD WITHOUT CONTRAST
TECHNIQUE: Contiguous axial images were obtained from the base of the skull
through the vertex without intravenous contrast.

[Series 2: head wo · axial · 0.46mm/px · z∈[-140,-15]mm · 8 of 31 slices shown, 10 images]
[im 3/31  brain]
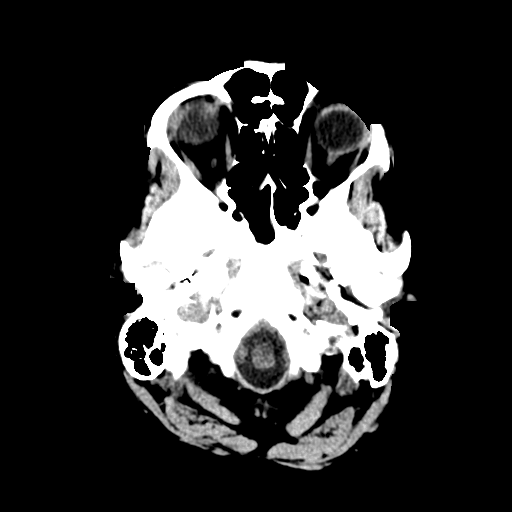
[im 3/31  bone]
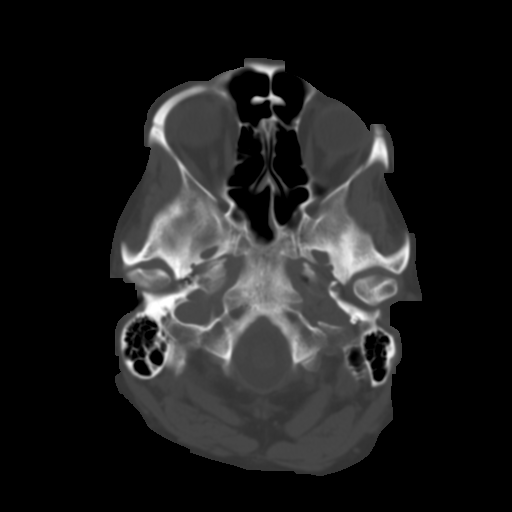
[im 7/31  brain]
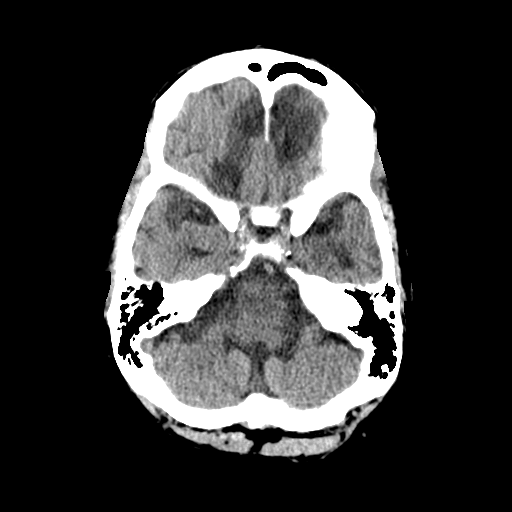
[im 10/31  brain]
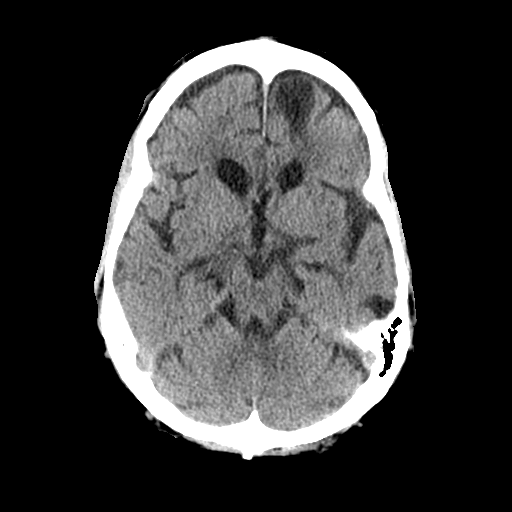
[im 14/31  brain]
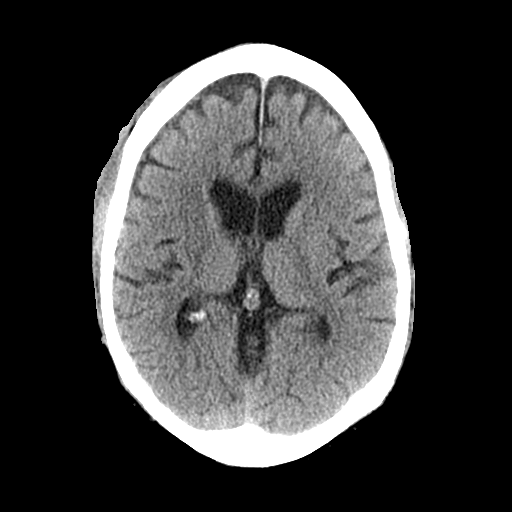
[im 17/31  brain]
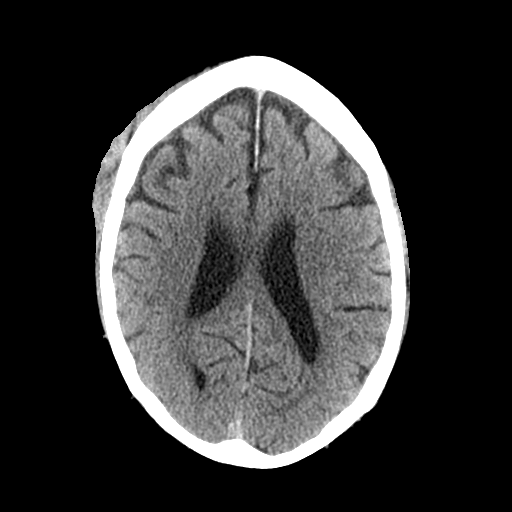
[im 17/31  bone]
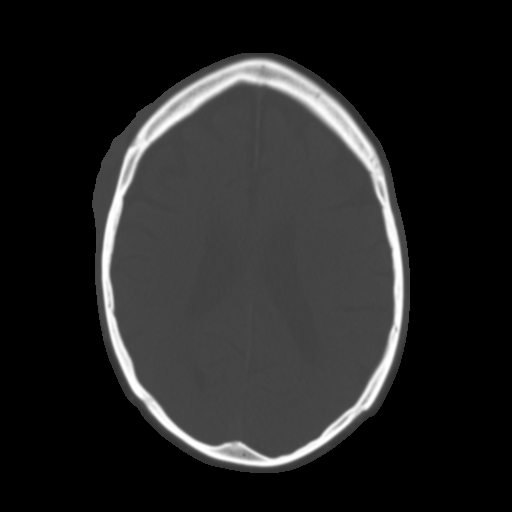
[im 21/31  brain]
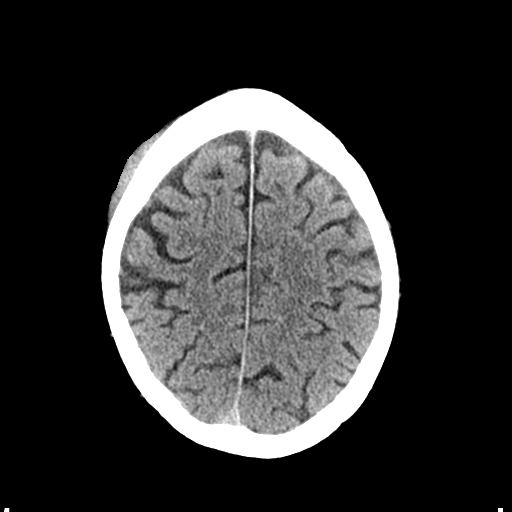
[im 24/31  brain]
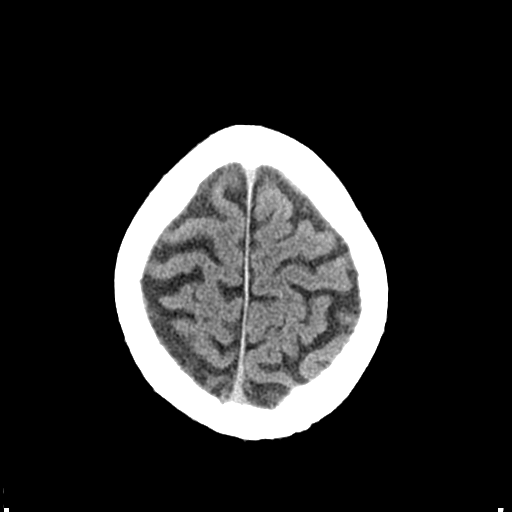
[im 28/31  brain]
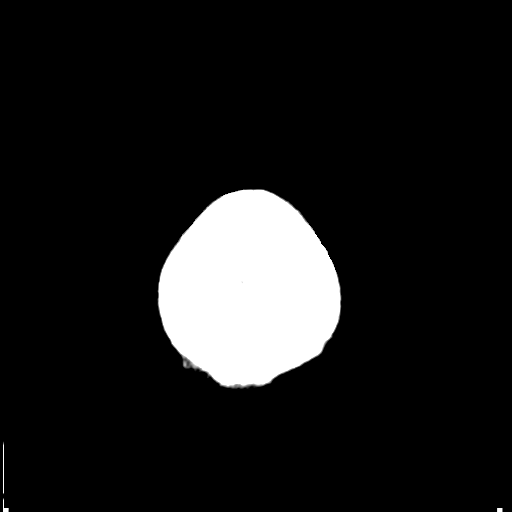

[Series 4: coronal soft tissue · coronal · 0.32mm/px · 3 of 65 slices shown]
[im 22/65  brain]
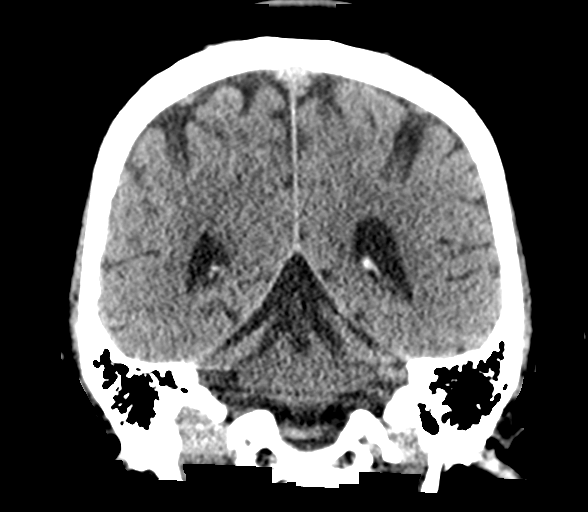
[im 29/65  brain]
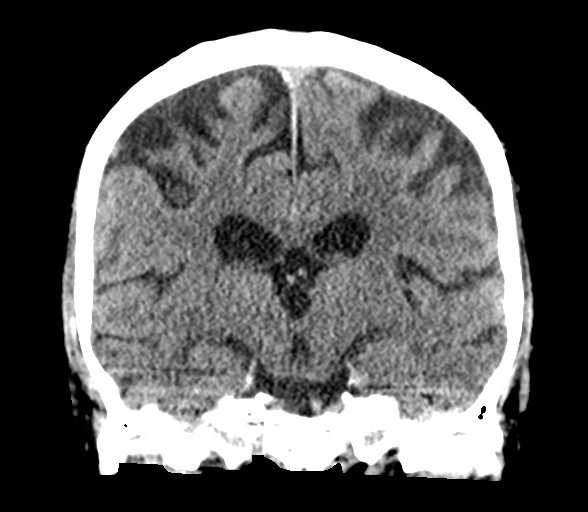
[im 36/65  brain]
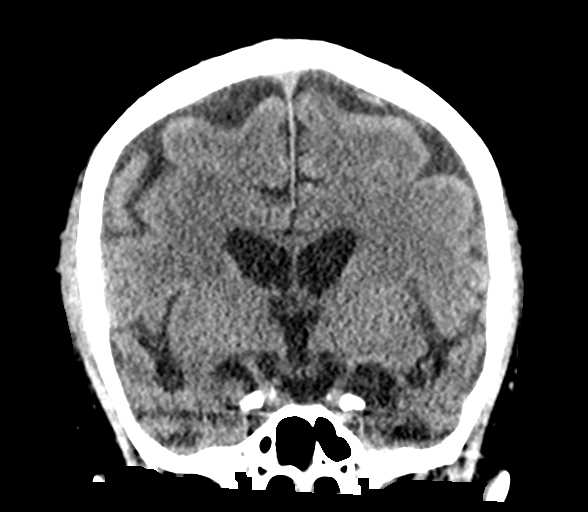

[Series 5: sagittal soft tissue · sagittal · 0.31mm/px · 3 of 56 slices shown]
[im 19/56  brain]
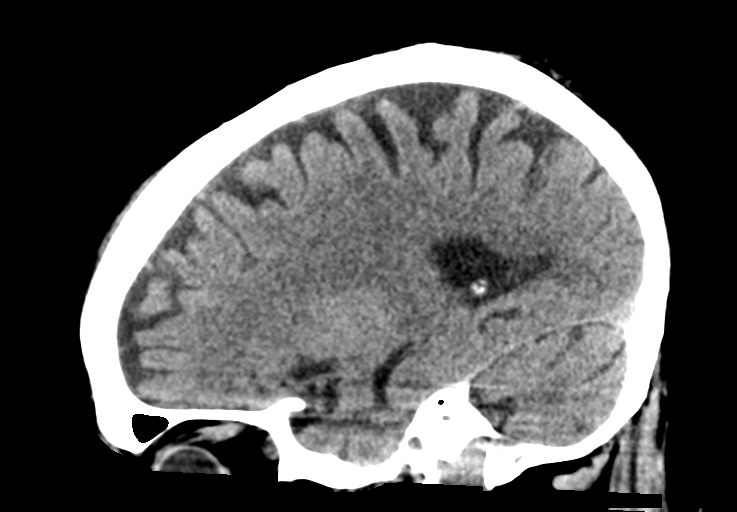
[im 28/56  brain]
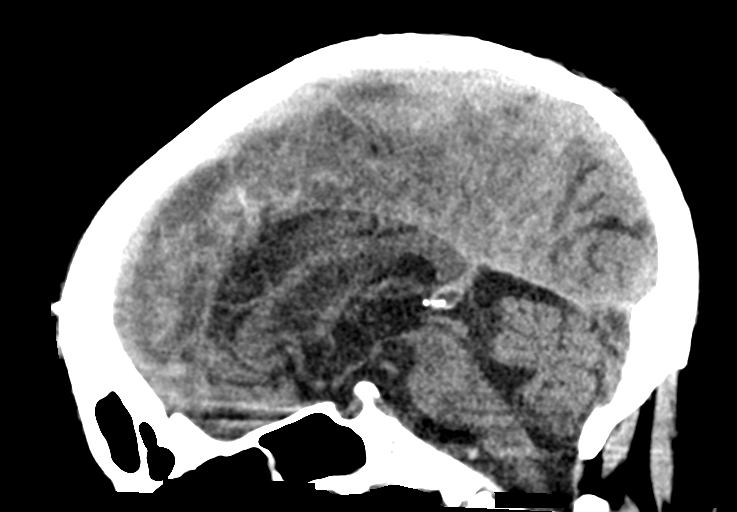
[im 37/56  brain]
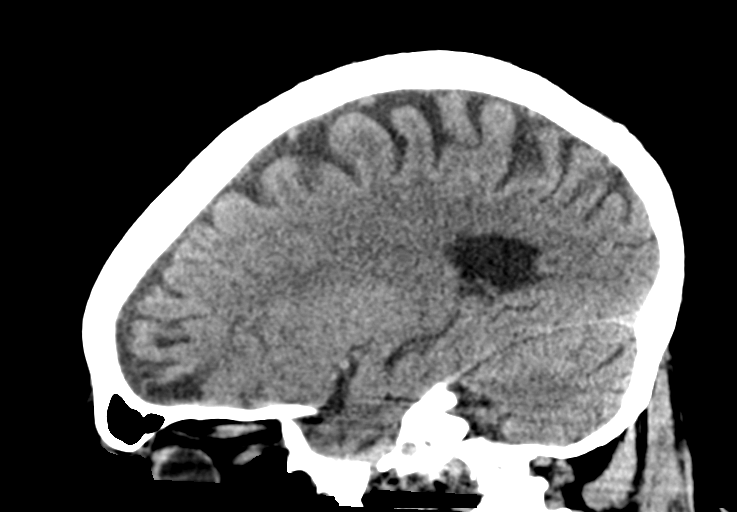

[14 of 47 positions shown; findings below may reference images not displayed]

FINDINGS: Brain: Mild cerebral atrophy. Patchy and confluent areas of
decreased attenuation are noted throughout the deep and
periventricular white matter of the cerebral hemispheres
bilaterally, compatible with chronic microvascular ischemic disease.
Areas of low attenuation in the frontal lobes bilaterally, and
anterior temporal lobes bilaterally (left greater than right)
similar to the prior study, compatible with areas of
encephalomalacia/gliosis. No evidence of acute infarction,
hemorrhage, hydrocephalus, extra-axial collection or mass
lesion/mass effect.

Vascular: No hyperdense vessel or unexpected calcification.

Skull: Normal. Negative for fracture or focal lesion.

Sinuses/Orbits: No acute finding.

Other: Large amount of right frontal scalp high attenuation soft
tissue swelling measuring up to 8.8 x 6.5 x 1.4 cm, compatible with
reported scalp laceration and underlying hematoma.
IMPRESSION: 1. Large right frontal scalp hematoma. No displaced skull fracture
or signs of significant acute intracranial trauma.
2. Areas of encephalomalacia in the frontal lobes bilaterally and in
the anterior temporal lobes bilaterally, compatible with
encephalomalacia/gliosis, likely related to remote trauma.
3. Mild cerebral atrophy with mild chronic microvascular ischemic
changes in the cerebral white matter.

## 2021-03-22 IMAGING — DX DG CHEST 1V PORT
1 series · 1 of 1 positions shown · non-contrast
Comparison: 03/03/2019

CLINICAL DATA: Alcohol withdrawal

EXAM:
PORTABLE CHEST 1 VIEW

[chest ap]
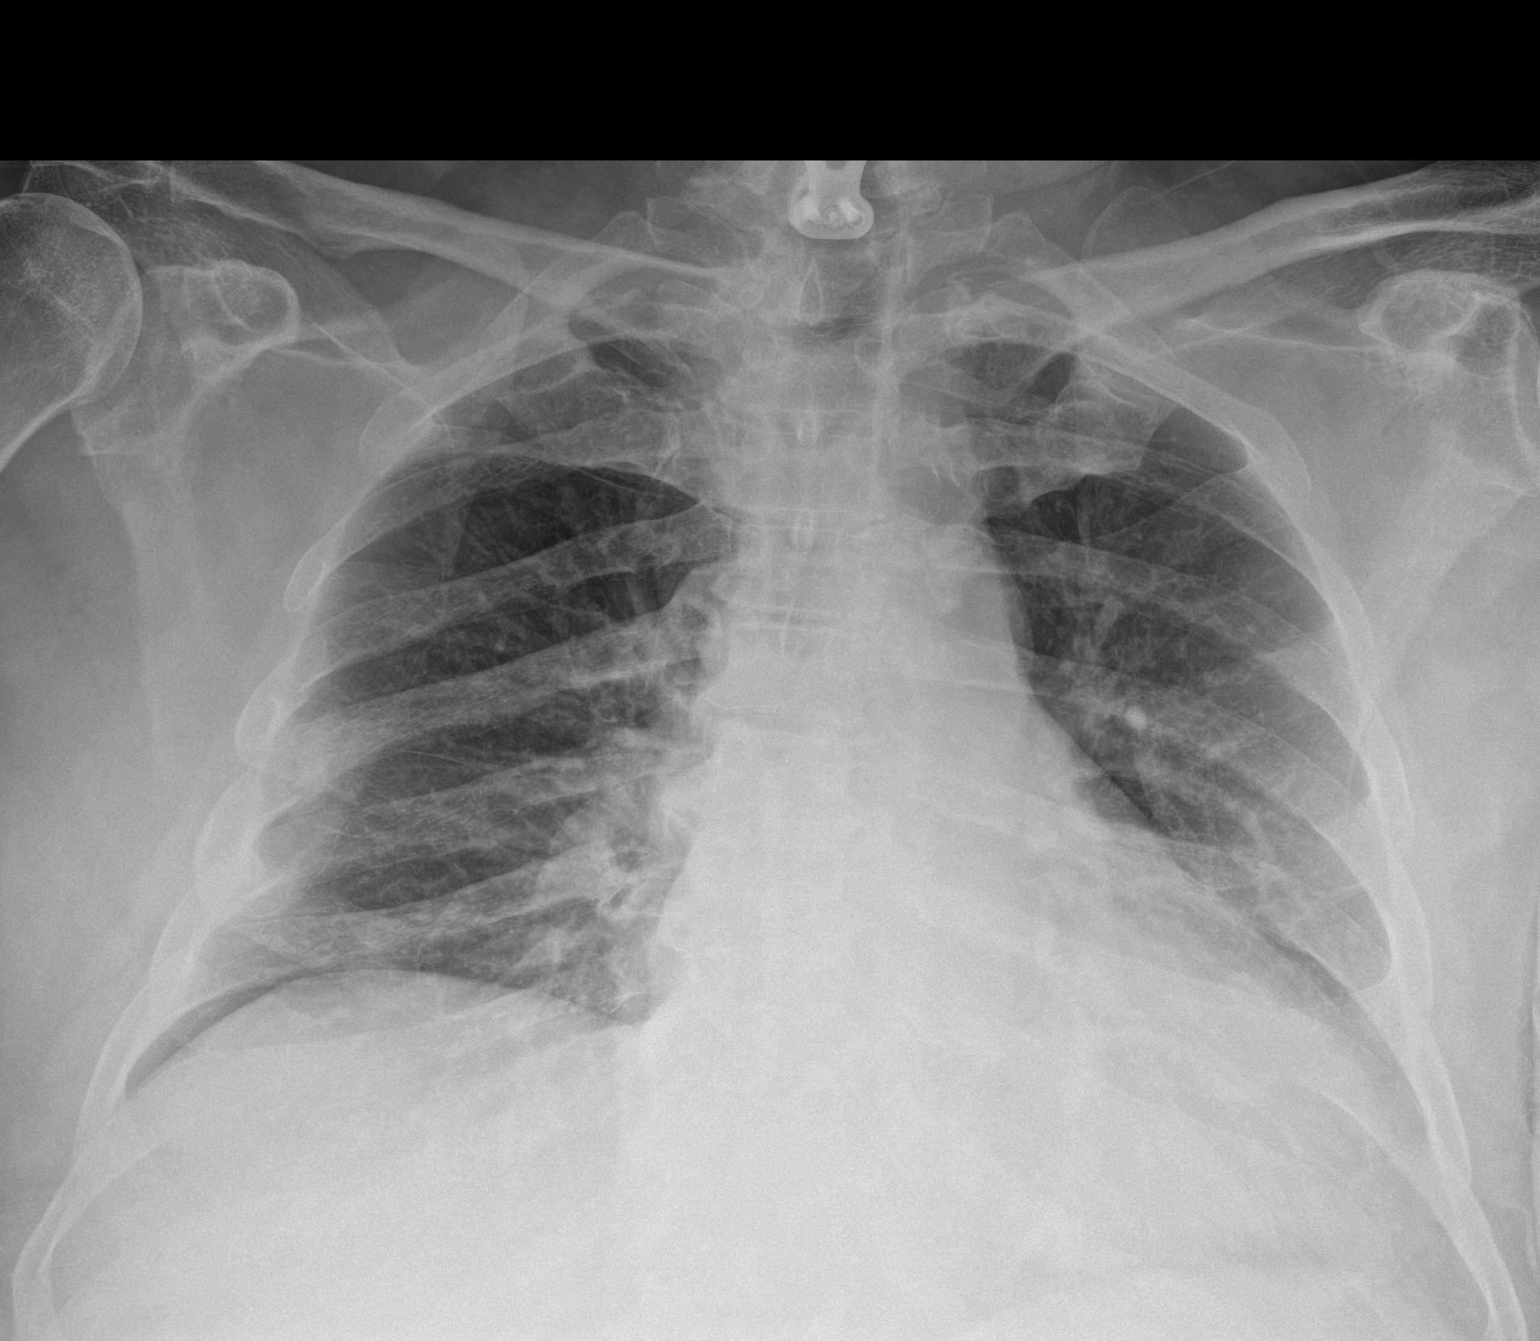

[1 of 1 positions shown; findings below may reference images not displayed]

FINDINGS: Cardiac silhouette appears mildly enlarged, although this is likely
accentuated by low lung volumes and portable AP technique. No focal
airspace consolidation, pleural effusion, or pneumothorax. Lower
cervical fusion hardware.
IMPRESSION: No active disease.
